# Patient Record
Sex: Male | Born: 1950
Health system: Southern US, Community
[De-identification: ages and names within clinical notes are randomized; demographics above are authoritative.]

## PROBLEM LIST (undated history)

## (undated) DIAGNOSIS — Z9889 Other specified postprocedural states: Secondary | ICD-10-CM

## (undated) DIAGNOSIS — I10 Essential (primary) hypertension: Secondary | ICD-10-CM

## (undated) DIAGNOSIS — B2790 Infectious mononucleosis, unspecified without complication: Secondary | ICD-10-CM

## (undated) DIAGNOSIS — M722 Plantar fascial fibromatosis: Secondary | ICD-10-CM

## (undated) DIAGNOSIS — B029 Zoster without complications: Secondary | ICD-10-CM

## (undated) DIAGNOSIS — R112 Nausea with vomiting, unspecified: Secondary | ICD-10-CM

## (undated) DIAGNOSIS — E291 Testicular hypofunction: Secondary | ICD-10-CM

## (undated) DIAGNOSIS — N400 Enlarged prostate without lower urinary tract symptoms: Secondary | ICD-10-CM

## (undated) HISTORY — PX: MOUTH SURGERY: SHX715

## (undated) HISTORY — PX: HERNIA REPAIR: SHX51

## (undated) HISTORY — DX: Plantar fascial fibromatosis: M72.2

## (undated) HISTORY — DX: Benign prostatic hyperplasia without lower urinary tract symptoms: N40.0

## (undated) HISTORY — DX: Infectious mononucleosis, unspecified without complication: B27.90

## (undated) HISTORY — PX: INGUINAL HERNIA REPAIR: SHX194

## (undated) HISTORY — DX: Zoster without complications: B02.9

## (undated) HISTORY — PX: POLYPECTOMY: SHX149

## (undated) HISTORY — DX: Essential (primary) hypertension: I10

## (undated) HISTORY — DX: Testicular hypofunction: E29.1

---
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
## 1988-02-17 HISTORY — PX: WISDOM TOOTH EXTRACTION: SHX21

## 1999-07-18 ENCOUNTER — Encounter (INDEPENDENT_AMBULATORY_CARE_PROVIDER_SITE_OTHER): Payer: Self-pay

## 1999-07-18 ENCOUNTER — Other Ambulatory Visit: Admission: RE | Admit: 1999-07-18 | Discharge: 1999-07-18 | Payer: Self-pay | Admitting: Gastroenterology

## 2004-11-24 ENCOUNTER — Ambulatory Visit: Payer: Self-pay | Admitting: Internal Medicine

## 2005-01-21 ENCOUNTER — Ambulatory Visit: Payer: Self-pay | Admitting: Internal Medicine

## 2005-02-16 HISTORY — PX: OTHER SURGICAL HISTORY: SHX169

## 2005-09-25 ENCOUNTER — Ambulatory Visit (HOSPITAL_BASED_OUTPATIENT_CLINIC_OR_DEPARTMENT_OTHER): Admission: RE | Admit: 2005-09-25 | Discharge: 2005-09-25 | Payer: Self-pay | Admitting: Urology

## 2006-03-26 ENCOUNTER — Ambulatory Visit: Payer: Self-pay | Admitting: Internal Medicine

## 2006-03-26 LAB — CONVERTED CEMR LAB
Calcium: 9.3 mg/dL (ref 8.4–10.5)
Chloride: 104 meq/L (ref 96–112)
Creatinine, Ser: 0.9 mg/dL (ref 0.4–1.5)
Glucose, Bld: 101 mg/dL — ABNORMAL HIGH (ref 70–99)
Hgb A1c MFr Bld: 5.2 % (ref 4.6–6.0)
Sodium: 142 meq/L (ref 135–145)

## 2006-04-01 ENCOUNTER — Ambulatory Visit: Payer: Self-pay | Admitting: Internal Medicine

## 2006-04-01 LAB — CONVERTED CEMR LAB: Testosterone: 269.26 ng/dL — ABNORMAL LOW (ref 350.00–890)

## 2006-04-05 ENCOUNTER — Ambulatory Visit: Payer: Self-pay | Admitting: Internal Medicine

## 2007-01-18 ENCOUNTER — Encounter: Payer: Self-pay | Admitting: Internal Medicine

## 2007-01-22 ENCOUNTER — Encounter: Payer: Self-pay | Admitting: *Deleted

## 2007-01-22 DIAGNOSIS — E291 Testicular hypofunction: Secondary | ICD-10-CM

## 2007-01-22 DIAGNOSIS — N476 Balanoposthitis: Secondary | ICD-10-CM

## 2007-01-22 DIAGNOSIS — Z87442 Personal history of urinary calculi: Secondary | ICD-10-CM

## 2007-01-22 DIAGNOSIS — B029 Zoster without complications: Secondary | ICD-10-CM | POA: Insufficient documentation

## 2007-01-22 DIAGNOSIS — Z9889 Other specified postprocedural states: Secondary | ICD-10-CM | POA: Insufficient documentation

## 2007-01-22 DIAGNOSIS — M722 Plantar fascial fibromatosis: Secondary | ICD-10-CM | POA: Insufficient documentation

## 2007-02-07 ENCOUNTER — Telehealth: Payer: Self-pay | Admitting: Internal Medicine

## 2007-03-17 ENCOUNTER — Ambulatory Visit: Payer: Self-pay | Admitting: Internal Medicine

## 2007-03-17 DIAGNOSIS — J069 Acute upper respiratory infection, unspecified: Secondary | ICD-10-CM | POA: Insufficient documentation

## 2007-05-21 ENCOUNTER — Encounter: Payer: Self-pay | Admitting: Internal Medicine

## 2007-06-15 ENCOUNTER — Encounter: Payer: Self-pay | Admitting: Internal Medicine

## 2007-10-20 ENCOUNTER — Telehealth: Payer: Self-pay | Admitting: Internal Medicine

## 2007-12-16 ENCOUNTER — Ambulatory Visit: Payer: Self-pay | Admitting: Internal Medicine

## 2008-04-18 ENCOUNTER — Telehealth: Payer: Self-pay | Admitting: Internal Medicine

## 2008-04-27 ENCOUNTER — Telehealth: Payer: Self-pay | Admitting: Internal Medicine

## 2008-06-12 ENCOUNTER — Encounter: Payer: Self-pay | Admitting: Internal Medicine

## 2008-06-29 ENCOUNTER — Ambulatory Visit: Payer: Self-pay | Admitting: Endocrinology

## 2008-11-13 ENCOUNTER — Ambulatory Visit: Payer: Self-pay | Admitting: Internal Medicine

## 2009-03-20 ENCOUNTER — Telehealth: Payer: Self-pay | Admitting: Internal Medicine

## 2009-03-21 ENCOUNTER — Telehealth (INDEPENDENT_AMBULATORY_CARE_PROVIDER_SITE_OTHER): Payer: Self-pay | Admitting: *Deleted

## 2009-04-05 ENCOUNTER — Telehealth: Payer: Self-pay | Admitting: Internal Medicine

## 2009-07-02 ENCOUNTER — Encounter (INDEPENDENT_AMBULATORY_CARE_PROVIDER_SITE_OTHER): Payer: Self-pay | Admitting: *Deleted

## 2009-07-16 ENCOUNTER — Encounter: Payer: Self-pay | Admitting: Internal Medicine

## 2009-07-16 ENCOUNTER — Telehealth: Payer: Self-pay | Admitting: Internal Medicine

## 2009-07-25 ENCOUNTER — Encounter: Payer: Self-pay | Admitting: Internal Medicine

## 2009-08-02 ENCOUNTER — Encounter: Payer: Self-pay | Admitting: Internal Medicine

## 2009-11-12 ENCOUNTER — Telehealth: Payer: Self-pay | Admitting: Internal Medicine

## 2009-11-15 ENCOUNTER — Telehealth: Payer: Self-pay | Admitting: Internal Medicine

## 2009-12-23 ENCOUNTER — Telehealth: Payer: Self-pay | Admitting: Internal Medicine

## 2010-03-18 NOTE — Progress Notes (Signed)
  Phone Note Refill Request Message from:  Fax from Pharmacy on November 15, 2009 10:49 AM  Refills Requested: Medication #1:  ANDROGEL 1 %  GEL (TESTOSTERONE) apply one packet  to skin every morning   Last Refilled: 09/25/2009 Fax from [facility] on [address] in [place], please Advise refills  Initial call taken by: Ami Bullins CMA,  November 15, 2009 10:50 AM  Follow-up for Phone Call        ok to refill as needed  Follow-up by: Jacques Navy MD,  November 15, 2009 1:03 PM    Prescriptions: ANDROGEL 1 %  GEL (TESTOSTERONE) apply one packet  to skin every morning  #90 x 1   Entered by:   Ami Bullins CMA   Authorized by:   Jacques Navy MD   Signed by:   Bill Salinas CMA on 11/15/2009   Method used:   Faxed to ...       [facility] [id number] (retail)       [address]       [place], [place]  [address]       Ph: [phone] or [phone]       Fax: [phone]   RxID:   [id number]

## 2010-03-18 NOTE — Progress Notes (Signed)
Summary: PA--Androgel  Phone Note From Pharmacy   Summary of Call: PA request--Androgel. Form completed. Initial call taken by: Lucious Groves,  Jul 16, 2009 9:12 AM  Follow-up for Phone Call        [facility] will not process prior authorization without the following: 1. What other meds have been tried 2. Does the patient have/had dx of hypogonadism. See form for review. Thanks Follow-up by: Lucious Groves,  July 24, 2009 10:20 AM  Additional Follow-up for Phone Call Additional follow up Details #1::        Patient has dx of hypgonadism - 04/01/06 testosterone level 269 ( nl [id number]).  No other products tried - this works and he has stayed with it. Additional Follow-up by: Jacques Navy MD,  July 24, 2009 5:00 PM    Additional Follow-up for Phone Call Additional follow up Details #2::    corrected and re-faxed for approval on 07-18-09. Still awaiting insurance company reply. Follow-up by: Lucious Groves,  July 29, 2009 4:39 PM   Appended Document: PA--Androgel approved until 2012.

## 2010-03-18 NOTE — Medication Information (Signed)
Summary: Prior Auth/[facility]  Prior Auth/[facility]   Imported By: Lester [place] 07/26/2009 10:28:38  _____________________________________________________________________  External Attachment:    Type:   Image     Comment:   External Document

## 2010-03-18 NOTE — Progress Notes (Signed)
  Phone Note Refill Request Message from:  Fax from Pharmacy on March 20, 2009 9:16 AM  Refills Requested: Medication #1:  ANDROGEL PUMP 1 %  GEL apply one packet  to skin every morning   Last Refilled: 09/12/2008 Please Advise refill, recieved fax from [facility] [address]   Initial call taken by: Bill Salinas CMA,  March 20, 2009 9:16 AM  Follow-up for Phone Call        OKI for refill prn Follow-up by: Jacques Navy MD,  March 20, 2009 3:04 PM    Prescriptions: ANDROGEL PUMP 1 %  GEL (TESTOSTERONE) apply one packet  to skin every morning  #30 x 2   Entered by:   Bill Salinas CMA   Authorized by:   Jacques Navy MD   Signed by:   Bill Salinas CMA on 03/21/2009   Method used:   Telephoned to ...       [facility] [address] (retail)       [phone]       [place], [place]  [address]       Ph: [phone]       Fax: [phone]   RxID:   [id number]

## 2010-03-18 NOTE — Letter (Signed)
Summary: Colonoscopy Letter  [facility]  [address] [place], [place] [address]   Phone: [phone]  Fax: [phone]      Jul 02, 2009 MRN: [id number]   Richard [name] [address] [place], [place]  [address]   Dear [name],   According to your medical record, it is time for you to schedule a Colonoscopy. The American Cancer Society recommends this procedure as a method to detect early colon cancer. Patients with a family history of colon cancer, or a personal history of colon polyps or inflammatory bowel disease are at increased risk.  This letter has beeen generated based on the recommendations made at the time of your procedure. If you feel that in your particular situation this may no longer apply, please contact our office.  Please call our office at [phone] to schedule this appointment or to update your records at your earliest convenience.  Thank you for cooperating with [place] to provide you with the very best care possible.   Sincerely,  Judie Petit T. Russella Dar, M.D.  [facility]astro[facility]ology Division [phone]

## 2010-03-18 NOTE — Progress Notes (Signed)
Summary: Hemocult  Phone Note Call from Patient Call back at [facility] Phone [phone]   Summary of Call: Patient is requesting hemocult cards. Ok to provide?  Initial call taken by: Lamar Sprinkles, CMA,  December 23, 2009 9:43 AM  Follow-up for Phone Call        per chart patient had recall letter in May for colonoscopy - this is much more important than hemocult cards. He should call GI to schedule colonoscopy. Follow-up by: Jacques Navy MD,  December 23, 2009 1:58 PM  Additional Follow-up for Phone Call Additional follow up Details #1::        Left detailed vm for patient.  Additional Follow-up by: Lamar Sprinkles, CMA,  December 23, 2009 2:12 PM

## 2010-03-18 NOTE — Medication Information (Signed)
Summary: Androgel Approved/[facility]  Androgel Approved/[facility]   Imported By: Sherian Rein 08/05/2009 14:54:28  _____________________________________________________________________  External Attachment:    Type:   Image     Comment:   External Document

## 2010-03-18 NOTE — Progress Notes (Signed)
Summary: REFILL ANDROGEL  Phone Note Call from Patient Call back at [facility] Phone [phone]   Initial call taken by: Lamar Sprinkles, CMA,  April 05, 2009 10:46 AM Summary of Call: Patient is requesting refill of testosterone. Rx was called in 03/20/2009. Need to clarify which pharm pt wants rx to go to.  Initial call taken by: Lamar Sprinkles, CMA,  April 05, 2009 10:47 AM  Follow-up for Phone Call        [facility] [address] in [place] Follow-up by: Ami Bullins CMA,  April 05, 2009 11:27 AM  Additional Follow-up for Phone Call Additional follow up Details #1::        Pt informed  Additional Follow-up by: Lamar Sprinkles, CMA,  April 05, 2009 5:41 PM    Prescriptions: ANDROGEL 1 %  GEL (TESTOSTERONE) apply one packet  to skin every morning  #90 x 1   Entered by:   Lamar Sprinkles, CMA   Authorized by:   Jacques Navy MD   Signed by:   Lamar Sprinkles, CMA on 04/05/2009   Method used:   Telephoned to ...       [facility] [address] (retail)       [address] [place], [place]  [address]       Ph: [phone] or [phone]       Fax: [phone]   RxID:   [id number]

## 2010-03-18 NOTE — Progress Notes (Signed)
Summary: REFILL   Phone Note Refill Request Message from:  Pharmacy  Refills Requested: Medication #1:  CARDURA 4 MG  TABS Take 1 tablet by mouth once a day   Notes: [facility]- supply w/3rfs Initial call taken by: Lamar Sprinkles, CMA,  November 12, 2009 10:54 AM  Follow-up for Phone Call        ok to refill as needed  Follow-up by: Jacques Navy MD,  November 12, 2009 1:10 PM    Prescriptions: CARDURA 4 MG  TABS (DOXAZOSIN MESYLATE) Take 1 tablet by mouth once a day  #90 Each x 2   Entered by:   Bill Salinas CMA   Authorized by:   Jacques Navy MD   Signed by:   Bill Salinas CMA on 11/12/2009   Method used:   Faxed to ...       [facility] education [facility])       [address] [address]       [place], [place]  [address]       Ph: [phone]       Fax: [phone]   RxID:   [id number]

## 2010-03-18 NOTE — Medication Information (Signed)
Summary: Prior Autho for Androgel/[facility]  Prior Autho for [employer]   Imported By: Sherian Rein 07/22/2009 14:53:40  _____________________________________________________________________  External Attachment:    Type:   Image     Comment:   External Document

## 2010-03-18 NOTE — Medication Information (Signed)
Summary: Andrgel Delayed/[facility]  Andrgel Delayed/[facility]   Imported By: Sherian Rein 07/29/2009 08:08:08  _____________________________________________________________________  External Attachment:    Type:   Image     Comment:   External Document

## 2010-03-18 NOTE — Progress Notes (Signed)
  Phone Note Call from Patient   Caller: Elita Quick [phone] [facility] Call For:  Dr Debby Bud Summary of Call: Please call pharmacy they have question about prescription was called in today, pump or packet?  Initial call taken by: Verdell Face,  March 21, 2009 9:45 AM  Follow-up for Phone Call        informed pharm [name]) that pt get 50mg . Follow-up by: Ami Bullins CMA,  March 21, 2009 3:50 PM    New/Updated Medications: * ANDROGEL 1 %  GEL (TESTOSTERONE) apply one packet  to skin every morning

## 2010-07-04 NOTE — Assessment & Plan Note (Signed)
[facility]                           PRIMARY CARE OFFICE NOTE   Richard [name], Richard [name]                    MRN:          [id number]  DATE:04/01/2006                            DOB:          [birth year]    Richard [name] is a 60 year old gentleman who presents for ongoing care  and evaluation.  He was last seen in the office on January 21, 2005, for  rash.   The patient's last physical exam was November 24, 2004, and please see  that dictated note with a full past medical history, family history and  social history.   CHIEF COMPLAINT:  1. Hip pain.  The patient reports he has had episodes of sudden onset      of fleeting, lancinating pain in his hip from buttock to leg on the      left.  This can happen at rest as well as with activity.  2. Plantar fasciitis, a chronic ongoing problem.  3. Tenderness about the great left toe.  4. History of upper respiratory infection about 1 month ago with      persistent cough, but this has recently resolved.   CURRENT MEDICATIONS:  1. Cardura 2 mg daily.  2. Multivitamin, vitamin D, vitamin B, zinc and calcium.  3. Etodolac on a p.r.n. basis.   REVIEW OF SYSTEMS:  Negative for any fevers, sweats, chills or other  constitutional symptoms.  He has had an eye exam in the last 12 months.  No ENT or cardiovascular problems.  GU:  Significant for decreased  libido and decreased rigidity.   INTERVAL SOCIAL HISTORY:  The patient has changed work description,  doing much less management, and has cut his work week down to 50-55  hours.  With his extra time he is doing more exercise, including push-  ups, biking, sit-ups, and general health maintenance.   PHYSICAL EXAMINATION:  VITAL SIGNS:  Temperature was 98.3, blood  pressure was 109/70, pulse 69, weight 192, although the patient reports  he weighs 180 at home.  GENERAL APPEARANCE:  This is a well-nourished, tall gentleman in no  acute distress.  HEENT:   Normocephalic, atraumatic.  EACs and TMs unremarkable.  Oropharynx with native dentition in good repair, no buccal or palatal  lesions were noted.  Posterior pharynx was clear.  Conjunctivae and  sclerae were clear.  PERRLA, EOMI.  Funduscopic exam was unremarkable.  NECK:  Supple without thyromegaly.  NODES:  No adenopathy was noted in the cervical or supraclavicular  regions.  CHEST:  No CVA tenderness.  LUNGS:  Clear to auscultation and percussion.  CARDIOVASCULAR:  2+ radial pulse, no JVD or carotid bruits.  He had a  quiet precordium with regular rate and rhythm without murmurs, rubs or  gallops.  ABDOMEN:  Soft, no guarding, no rebound.  No organosplenomegaly was  appreciated.  GENITALIA:  Normal male, bilaterally descended testicles without masses.  RECTAL:  Normal sphincter tone was noted.  Prostate was smooth, round,  and normal size and contour without nodules.  EXTREMITIES:  Without clubbing, cyanosis, edema or deformity.  NEUROLOGIC:  Nonfocal.   DATA  BASE:  Electrolytes were unremarkable.  Serum glucose was normal at  101.  A1c was normal at 5.2%.  PSA was normal at 0.45.   ASSESSMENT AND PLAN:  1. Benign prostatic hypertrophy.  The patient is doing very well with      Cardura.  2. Health maintenance.  The patient would be due for a colonoscopy,      and referral will be made.  The patient's laboratory work is      unremarkable.  3. Patient with some question or decreased libido.  He will be sent      for a testosterone level as a first step in his evaluation.  He      will be notified by phone tree of these results.   In summary, he seems to be a very healthy gentleman doing well at this  time.  He will be scheduled for colonoscopy.  He will be notified of his  labs by phone tree.   addendum: testosterone level was low.     Rosalyn Gess Norins, MD  Electronically Signed    MEN/MedQ  DD: 04/02/2006  DT: 04/02/2006  Job #: [id number]   cc:   Richard [name]

## 2010-07-04 NOTE — Op Note (Signed)
Richard [name], Richard [name]             ACCOUNT NO.:  [id number]   MEDICAL RECORD NO.:  [id number]          PATIENT TYPE:  AMB   LOCATION:  [facility]                         [facility]ACILITY[facility]  [facility]   PHYSICIAN:  Boston Service, M.D.DATE OF BIRTH:  1951-01-13   DATE OF PROCEDURE:  09/25/2005  DATE OF DISCHARGE:                                 OPERATIVE REPORT   PREOPERATIVE DIAGNOSIS:  Phimosis.   POSTOPERATIVE DIAGNOSIS:  Phimosis and balanitis.   PROCEDURE:  Circumcision.   SURGEON:  Boston Service, M.D.   ASSISTANTS:  None.   ANESTHESIA:  General.   SPECIMENS:  None.   ESTIMATED BLOOD LOSS:  Minimal.   COMPLICATIONS:  None obvious.   DESCRIPTION OF PROCEDURE:  The patient was prepped and draped in the supine  position after institution of adequate level general anesthesia.  Penile  block with 0.25% lidocaine without epinephrine was instituted at the base of  the penis.  A circumferential incision was made proximal to the sub-coronal  sulcus.  A similar incision was made proximal to the original incision and a  ring of redundant preputial skin was removed in a parallel lines  technique.  Subcu was reapproximated with interrupted sutures of 4-0  Vicryl.  Skin was reapproximated with interrupted sutures of 4-0 chromic.  The wound was covered with bacitracin ointment, dry gauze and Coban tape.  The patient was returned to Recovery in satisfactory condition.           ______________________________  Boston Service, M.D.     RH/MEDQ  D:  09/25/2005  T:  09/25/2005  Job:  [id number]   cc:   Oley Balm. Georgina Pillion, M.D.  Fax: [phone]

## 2010-10-10 ENCOUNTER — Telehealth: Payer: Self-pay

## 2010-10-17 NOTE — Telephone Encounter (Signed)
Left another message regarding scheduling recall Colonoscopy. Letter to be mailed. 

## 2011-02-17 HISTORY — PX: COLONOSCOPY: SHX174

## 2011-04-16 ENCOUNTER — Ambulatory Visit (INDEPENDENT_AMBULATORY_CARE_PROVIDER_SITE_OTHER): Payer: BC Managed Care – PPO | Admitting: Internal Medicine

## 2011-04-16 ENCOUNTER — Encounter: Payer: Self-pay | Admitting: Internal Medicine

## 2011-04-16 ENCOUNTER — Other Ambulatory Visit (INDEPENDENT_AMBULATORY_CARE_PROVIDER_SITE_OTHER): Payer: BC Managed Care – PPO

## 2011-04-16 VITALS — BP 102/66 | HR 83 | Temp 96.8°F | Resp 16 | Ht 72.25 in | Wt 201.8 lb

## 2011-04-16 DIAGNOSIS — Z0000 Encounter for general adult medical examination without abnormal findings: Secondary | ICD-10-CM

## 2011-04-16 DIAGNOSIS — Z136 Encounter for screening for cardiovascular disorders: Secondary | ICD-10-CM

## 2011-04-16 DIAGNOSIS — Z23 Encounter for immunization: Secondary | ICD-10-CM

## 2011-04-16 DIAGNOSIS — Z87898 Personal history of other specified conditions: Secondary | ICD-10-CM

## 2011-04-16 DIAGNOSIS — E291 Testicular hypofunction: Secondary | ICD-10-CM

## 2011-04-16 LAB — LIPID PANEL
Cholesterol: 153 mg/dL (ref 0–200)
Total CHOL/HDL Ratio: 4
Triglycerides: 262 mg/dL — ABNORMAL HIGH (ref 0.0–149.0)
VLDL: 52.4 mg/dL — ABNORMAL HIGH (ref 0.0–40.0)

## 2011-04-16 LAB — LDL CHOLESTEROL, DIRECT: Direct LDL: 80.9 mg/dL

## 2011-04-16 LAB — COMPREHENSIVE METABOLIC PANEL
Albumin: 4.2 g/dL (ref 3.5–5.2)
BUN: 19 mg/dL (ref 6–23)
Calcium: 9 mg/dL (ref 8.4–10.5)
Chloride: 105 mEq/L (ref 96–112)
Creatinine, Ser: 1 mg/dL (ref 0.4–1.5)
GFR: 83.43 mL/min (ref >60.00)
Glucose, Bld: 96 mg/dL (ref 70–99)
Potassium: 4.2 mEq/L (ref 3.5–5.1)

## 2011-04-16 LAB — HEPATIC FUNCTION PANEL
Alkaline Phosphatase: 67 U/L (ref 39–117)
Bilirubin, Direct: 0.1 mg/dL (ref 0.0–0.3)
Total Bilirubin: 0.6 mg/dL (ref 0.3–1.2)

## 2011-04-16 NOTE — Progress Notes (Signed)
Subjective:    Patient ID: Richard [name], male    DOB: 1950/11/14, 61 y.o.   MRN: [id number]  HPI Richard [name] presents for a routine general medical exam. He has had a cold/sore throat for 3 weeks - felt like strep. For several days he has had more sinus symptoms. He has not had any major illnesses, surgeries, or injuries. He has some minor onychyomycosis.  Past Medical History  Diagnosis Date  . Mumps   . Zoster   . Mononucleosis   . BPH (benign prostatic hyperplasia)   . Hypogonadism male    Past Surgical History  Procedure Date  . Inguinal hernia repair childhood    left  . Circumsicion 2007    asa adult  . Mouth surgery     early eruption of incisors requiring surgical extraction   Family History  Problem Relation Age of Onset  . Arthritis Mother     spinal stenosis s/p surgery  . Diabetes Neg Hx   . Heart disease Neg Hx    History   Social History  . Marital Status: Married    Spouse Name: N/A    Number of Children: 3  . Years of Education: 16   Occupational History  . sales    Social History Main Topics  . Smoking status: Former Smoker    Quit date: 04/16/1967  . Smokeless tobacco: Never Used  . Alcohol Use: Yes     once or twice a month  . Drug Use: No  . Sexually Active: Yes -- Male partner(s)   Other Topics Concern  . Not on file   Social History Narrative   HSG, [place] - [place]. Married '75. 2 sons - '71(adopted), '76; 1 dtr (adopted) '73; 4 grandchildren.Work - Secretary/administrator, currently Therapist, occupational. Marriage is in good health. ACP - HCPOA - wife and son; yes - CPR; yes for acute care; no prolonged futile or heroic measures.JEHOVAH'S WITNESS - OK for cell salvage, volume expanded, immunoglobulins. Solid organ transplant - ????          Review of Systems Constitutional:  Negative for fever, chills, activity change and unexpected weight change.  HEENT:  Negative for hearing loss, ear pain, congestion, neck stiffness and postnasal  drip. Negative for sore throat or swallowing problems. Negative for dental complaints.   Eyes: Negative for vision loss or change in visual acuity.  Respiratory: Negative for chest tightness and wheezing. Negative for DOE.   Cardiovascular: Negative for chest pain or palpitations. No decreased exercise tolerance Gastrointestinal: No change in bowel habit. No bloating or gas. No reflux or indigestion Genitourinary: Negative for urgency, frequency, flank pain and difficulty urinating.  Musculoskeletal: Negative for myalgias, back pain, arthralgias and gait problem.  Neurological: Negative for dizziness, tremors, weakness and headaches.  Hematological: Negative for adenopathy.  Psychiatric/Behavioral: Negative for behavioral problems and dysphoric mood.       Objective:   Physical Exam Filed Vitals:   04/16/11 0914  BP: 102/66  Pulse: 83  Temp: 96.8 F (36 C)  Resp: 16   Gen'l: Well nourished well developed white male in no acute distress  HEENT: Head: Normocephalic and atraumatic. Right Ear: External ear normal. EAC with cerumen impaction/TM not seen. Left Ear: External ear normal.  EAC-cerumen impaction/TM not seen. Nose: Nose normal. Mouth/Throat: Oropharynx is clear and moist. Dentition - native, in good repair. No buccal or palatal lesions. Posterior pharynx clear. Eyes: Conjunctivae and sclera clear. EOM intact. Pupils are equal, round, and reactive  to light. Right eye exhibits no discharge. Left eye exhibits no discharge. Neck: Normal range of motion. Neck supple. No JVD present. No tracheal deviation present. No thyromegaly present.  Cardiovascular: Normal rate, regular rhythm, no gallop, no friction rub, no murmur heard.      Quiet precordium. 2+ radial and DP pulses . No carotid bruits Pulmonary/Chest: Effort normal. No respiratory distress or increased WOB, no wheezes, no rales. No chest wall deformity or CVAT. Abdominal: Soft. Bowel sounds are normal in all quadrants. He  exhibits no distension, no tenderness, no rebound or guarding, No heptosplenomegaly  Genitourinary:  deferred - reviewed USPHTF recommendations. Musculoskeletal: Normal range of motion. He exhibits no edema and no tenderness.       Small and large joints without redness, synovial thickening or deformity. Full range of motion preserved about all small, median and large joints.  Lymphadenopathy:    He has no cervical or supraclavicular adenopathy.  Neurological: He is alert and oriented to person, place, and time. CN II-XII intact. DTRs 2+ and symmetrical biceps, radial and patellar tendons. Cerebellar function normal with no tremor, rigidity, normal gait and station.  Skin: Skin is warm and dry. No rash noted. No erythema. Large - 7 cm soft tissue mass upper left back - non tender. Psychiatric: He has a normal mood and affect. His behavior is normal. Thought content normal.   Lab Results  Component Value Date   GLUCOSE 96 04/16/2011   CHOL 153 04/16/2011   TRIG 262.0* 04/16/2011   HDL 35.30* 04/16/2011   LDLDIRECT 80.9 04/16/2011        ALT 20 04/16/2011   AST 21 04/16/2011        NA 139 04/16/2011   K 4.2 04/16/2011   CL 105 04/16/2011   CREATININE 1.0 04/16/2011   BUN 19 04/16/2011   CO2 28 04/16/2011   TSH 1.47 04/16/2011   PSA 0.45 03/26/2006   HGBA1C 5.2 03/26/2006         Assessment & Plan:

## 2011-04-17 ENCOUNTER — Encounter: Payer: Self-pay | Admitting: Internal Medicine

## 2011-04-17 DIAGNOSIS — Z0000 Encounter for general adult medical examination without abnormal findings: Secondary | ICD-10-CM | POA: Insufficient documentation

## 2011-04-17 NOTE — Assessment & Plan Note (Signed)
Symptoms are adequately controlled with doxazosin. Plan - continue present regimen

## 2011-04-17 NOTE — Assessment & Plan Note (Signed)
He had been on testosterone replacement but discontinued on his own - did not really see any benefit. He has no functional problems

## 2011-04-17 NOTE — Assessment & Plan Note (Addendum)
Interval medical history is benign. Physical exam is normal except for mild excessive weight. Lab results are in normal range except for mildly elevated triglycerides which does not require any medical therapy - low carb diet and exercise should suffice. Will pull old record in regard to last colonoscopy. Immunizations - current for Tdap and Pneumonia vaccine.   In summary - a healthy, medically stable man. He will return for routine exam in 2-3 years otherwise on a prn basis.

## 2011-08-11 ENCOUNTER — Other Ambulatory Visit: Payer: Self-pay

## 2011-08-11 DIAGNOSIS — Z0000 Encounter for general adult medical examination without abnormal findings: Secondary | ICD-10-CM

## 2011-08-11 MED ORDER — DOXAZOSIN MESYLATE 4 MG PO TABS: 2.0000 mg | ORAL_TABLET | Freq: Every day | ORAL | Status: DC

## 2011-09-11 ENCOUNTER — Encounter: Payer: Self-pay | Admitting: Gastroenterology

## 2011-10-03 ENCOUNTER — Ambulatory Visit (INDEPENDENT_AMBULATORY_CARE_PROVIDER_SITE_OTHER): Payer: BC Managed Care – PPO | Admitting: Family Medicine

## 2011-10-03 ENCOUNTER — Encounter: Payer: Self-pay | Admitting: Family Medicine

## 2011-10-03 VITALS — BP 110/76 | HR 73 | Temp 98.1°F | Wt 204.0 lb

## 2011-10-03 DIAGNOSIS — J329 Chronic sinusitis, unspecified: Secondary | ICD-10-CM

## 2011-10-03 MED ORDER — AMOXICILLIN-POT CLAVULANATE 875-125 MG PO TABS: 1.0000 | ORAL_TABLET | Freq: Two times a day (BID) | ORAL | Status: AC

## 2011-10-03 NOTE — Progress Notes (Signed)
  Subjective:    Patient ID: Richard [name], male    DOB: 1950/09/11, 61 y.o.   MRN: [id number]  HPI Head and chest congestion x 2 weeks. Tried some OTC cough and cold symptoms. - some relief.  Cough is mostly dry.   Mucous is white/yellow. No fever, but not sure.   Some HA and pressure in the ears and ST for 2 weeks.  Feels like pin-pricks in the lungs.  +post nasal drip. No runny nose.     Review of Systems     Objective:   Physical Exam  Constitutional: He is oriented to person, place, and time. He appears well-developed and well-nourished.  HENT:  Head: Normocephalic and atraumatic.  Right Ear: External ear normal.  Left Ear: External ear normal.  Nose: Nose normal.  Mouth/Throat: Oropharynx is clear and moist.       TMs and canals are clear.   Eyes: Conjunctivae and EOM are normal. Pupils are equal, round, and reactive to light.  Neck: Neck supple. No thyromegaly present.  Cardiovascular: Normal rate and normal heart sounds.   Pulmonary/Chest: Effort normal and breath sounds normal.  Lymphadenopathy:    He has no cervical adenopathy.  Neurological: He is alert and oriented to person, place, and time.  Skin: Skin is warm and dry.  Psychiatric: He has a normal mood and affect.          Assessment & Plan:  Sinusitis - Will tx with augmentin since sxs x 2 weeks.  Call if not getting better or suddenly worse. Can continue OTC meds since does seem to help some.  Make sure staying hydrated.

## 2011-10-03 NOTE — Patient Instructions (Signed)

## 2011-12-17 ENCOUNTER — Encounter: Payer: Self-pay | Admitting: Gastroenterology

## 2012-01-18 ENCOUNTER — Ambulatory Visit (INDEPENDENT_AMBULATORY_CARE_PROVIDER_SITE_OTHER): Payer: BC Managed Care – PPO | Admitting: Internal Medicine

## 2012-01-18 ENCOUNTER — Encounter: Payer: Self-pay | Admitting: Internal Medicine

## 2012-01-18 VITALS — BP 110/60 | HR 81 | Temp 97.6°F | Resp 8 | Wt 206.1 lb

## 2012-01-18 DIAGNOSIS — H1130 Conjunctival hemorrhage, unspecified eye: Secondary | ICD-10-CM

## 2012-01-18 NOTE — Progress Notes (Signed)
  Subjective:    Patient ID: Richard [name], male    DOB: [birth year], 61 y.o.   MRN: [id number]  HPI Richard [name] presents due to sudden on set of a very red left eye. He had no trauma, no surgery, he denies pain, change in vision, drainage or matting.  PMH, FamHx and SocHx reviewed for any changes and relevance.  Current Outpatient Prescriptions on File Prior to Visit  Medication Sig Dispense Refill  . doxazosin (CARDURA) 4 MG tablet Take 0.5 tablets (2 mg total) by mouth at bedtime.  45 tablet  3  . OVER THE COUNTER MEDICATION Take by mouth as needed. OTC Cold medication.          Review of Systems System review is negative for any constitutional, cardiac, pulmonary, GI or neuro symptoms or complaints other than as described in the HPI.     Objective:   Physical Exam Filed Vitals:   01/18/12 1127  BP: 110/60  Pulse: 81  Temp: 97.6 F (36.4 C)  Resp: 8   Gen'l - WNWD white man in no distress HEENT- bulbar conjunctiva with circumferential hemorrhage, iris not involved, pupil round and reactive, vision grossly normal.       Assessment & Plan:  Subconjunctival hemorrhage - vision in tact  Plan- no intervention. Patient education provided (see AVS)

## 2012-01-18 NOTE — Patient Instructions (Addendum)
Subconjunctival hemorrhage left eye - usually a spontaneous benign problem, expecially if there is no involvement of the iris or pupil. Time is the only treatment.    Subconjunctival Hemorrhage A subconjunctival hemorrhage is a bright red patch covering a portion of the white of the eye. The white part of the eye is called the sclera, and it is covered by a thin membrane called the conjunctiva. This membrane is clear, except for tiny blood vessels that you can see with the naked eye. When your eye is irritated or inflamed and becomes red, it is because the vessels in the conjunctiva are swollen. Sometimes, a blood vessel in the conjunctiva can break and bleed. When this occurs, the blood builds up between the conjunctiva and the sclera, and spreads out to create a red area. The red spot may be very small at first. It may then spread to cover a larger part of the surface of the eye, or even all of the visible white part of the eye. In almost all cases, the blood will go away and the eye will become white again. Before completely dissolving, however, the red area may spread. It may also become brownish-yellow in color, before going away. If a lot of blood collects under the conjunctiva, it may look like a bulge on the surface of the eye. This looks scary, but it will also eventually flatten out and go away. Subconjunctival hemorrhages do not cause pain, but if swollen, may cause a feeling of irritation. There is no effect on vision.   CAUSES    The most common cause is mild trauma (rubbing the eye, irritation).   Subconjunctival hemorrhages can happen because of coughing or straining (lifting heavy objects), vomiting, or sneezing.   In some cases, your doctor may want to check your blood pressure. High blood pressure can also cause a sunconjunctival hemorrhage.   Severe trauma or blunt injuries.   Diseases that affect blood clotting (hemophilia, leukemia).   Abnormalities of blood vessels behind the  eye (carotid cavernous sinus fistula).   Tumors behind the eye.   Certain drugs (aspirin, coumadin, heparin).   Recent eye surgery.  HOME CARE INSTRUCTIONS    Do not worry about the appearance of your eye. You may continue your usual activities.   Often, follow-up is not necessary.  SEEK MEDICAL CARE IF:    Your eye becomes painful.   The bleeding does not disappear within 3 weeks.   Bleeding occurs elsewhere, for example, under the skin, in the mouth, or in the other eye.   You have recurring subconjunctival hemorrhages.  SEEK IMMEDIATE MEDICAL CARE IF:    Your vision changes or you have difficulty seeing.   You develop severe headache, persistent vomiting, confusion, or abnormal drowsiness (lethargy).   Your eye seems to bulge or protrude from the eye socket.   You notice the sudden appearance of bruises, or have spontaneous bleeding elsewhere on your body.  Document Released: 02/02/2005 Document Revised: 04/27/2011 Document Reviewed: 12/31/2008 [facility] Patient Information 2013 [place], [place].

## 2012-01-28 ENCOUNTER — Encounter: Payer: Self-pay | Admitting: Gastroenterology

## 2012-01-28 ENCOUNTER — Ambulatory Visit (AMBULATORY_SURGERY_CENTER): Payer: BC Managed Care – PPO | Admitting: *Deleted

## 2012-01-28 VITALS — Ht 73.0 in | Wt 207.6 lb

## 2012-01-28 DIAGNOSIS — Z1211 Encounter for screening for malignant neoplasm of colon: Secondary | ICD-10-CM

## 2012-01-28 MED ORDER — MOVIPREP 100 G PO SOLR: ORAL | Status: DC

## 2012-02-16 ENCOUNTER — Encounter: Payer: Self-pay | Admitting: Gastroenterology

## 2012-02-16 ENCOUNTER — Ambulatory Visit (AMBULATORY_SURGERY_CENTER): Payer: BC Managed Care – PPO | Admitting: Gastroenterology

## 2012-02-16 VITALS — BP 140/76 | HR 53 | Temp 97.6°F | Resp 20 | Ht 73.0 in | Wt 207.6 lb

## 2012-02-16 DIAGNOSIS — Z1211 Encounter for screening for malignant neoplasm of colon: Secondary | ICD-10-CM

## 2012-02-16 DIAGNOSIS — D126 Benign neoplasm of colon, unspecified: Secondary | ICD-10-CM

## 2012-02-16 MED ORDER — SODIUM CHLORIDE 0.9 % IV SOLN: 500.0000 mL | INTRAVENOUS | Status: DC

## 2012-02-16 NOTE — Op Note (Signed)
[facility] [address] [place], [address]   COLONOSCOPY PROCEDURE REPORT  PATIENT: [name], Richard  MR#: [id number] BIRTHDATE: [birth year] , 61  yrs. old GENDER: Male ENDOSCOPIST: Meryl Dare, MD, [facility] PROCEDURE DATE:  02/16/2012 PROCEDURE:   Colonoscopy with snare polypectomy ASA CLASS:   Class II INDICATIONS:average risk screening. MEDICATIONS: MAC sedation, administered by CRNA and propofol (Diprivan) 350mg  IV  DESCRIPTION OF PROCEDURE:   After the risks benefits and alternatives of the procedure were thoroughly explained, informed consent was obtained.  A digital rectal exam revealed no abnormalities of the rectum.   The Fuse-Demo-Scope  endoscope was introduced through the anus and advanced to the cecum, which was identified by both the appendix and ileocecal valve. No adverse events experienced.   The quality of the prep was excellent, using MoviPrep  The instrument was then slowly withdrawn as the colon was fully examined.     COLON FINDINGS: Images taken but only available in hard copy form that will be scanned into EPIC Agricultural [employer]).   A sessile polyp measuring 5 mm in size was found in the transverse colon.  A polypectomy was performed with a cold snare.  The resection was complete and the polyp tissue was completely retrieved.   Mild diverticulosis was noted in the transverse colon and sigmoid colon. The colon was otherwise normal.  There was no diverticulosis, inflammation, polyps or cancers unless previously stated. Retroflexed views revealed no abnormalities. The time to cecum=3 minutes 28 seconds.  Withdrawal time=9 minutes 57 seconds.  The scope was withdrawn and the procedure completed.  COMPLICATIONS: There were no complications.  ENDOSCOPIC IMPRESSION: 1.   Images taken but only available in hard copy form that will be scanned into EPIC [employer]). 2.   Sessile polyp measuring 5 mm in the transverse colon; polypectomy  performed with a cold snare 3.   Mild diverticulosis was noted in the transverse colon and sigmoid colon  RECOMMENDATIONS: 1.  High fiber diet with liberal fluid intake. 2.  Repeat Colonoscopy in 5 years.   eSigned:  Meryl Dare, MD, [facility] 02/16/2012 12:56 PM

## 2012-02-16 NOTE — Progress Notes (Signed)
Patient did not experience any of the following events: a burn prior to discharge; a fall within the facility; wrong site/side/patient/procedure/implant event; or a hospital transfer or hospital admission upon discharge from the facility. (G8907)Patient did not have preoperative order for IV antibiotic SSI prophylaxis. (G8918) ewm 

## 2012-02-16 NOTE — Patient Instructions (Signed)
YOU HAD AN ENDOSCOPIC PROCEDURE TODAY AT THE  ENDOSCOPY CENTER: Refer to the procedure report that was given to you for any specific questions about what was found during the examination.  If the procedure report does not answer your questions, please call your gastroenterologist to clarify.  If you requested that your care partner not be given the details of your procedure findings, then the procedure report has been included in a sealed envelope for you to review at your convenience later.  YOU SHOULD EXPECT: Some feelings of bloating in the abdomen. Passage of more gas than usual.  Walking can help get rid of the air that was put into your GI tract during the procedure and reduce the bloating. If you had a lower endoscopy (such as a colonoscopy or flexible sigmoidoscopy) you may notice spotting of blood in your stool or on the toilet paper. If you underwent a bowel prep for your procedure, then you may not have a normal bowel movement for a few days.  DIET: Your first meal following the procedure should be a light meal and then it is ok to progress to your normal diet.  A half-sandwich or bowl of soup is an example of a good first meal.  Heavy or fried foods are harder to digest and may make you feel nauseous or bloated.  Likewise meals heavy in dairy and vegetables can cause extra gas to form and this can also increase the bloating.  Drink plenty of fluids but you should avoid alcoholic beverages for 24 hours.  ACTIVITY: Your care partner should take you home directly after the procedure.  You should plan to take it easy, moving slowly for the rest of the day.  You can resume normal activity the day after the procedure however you should NOT DRIVE or use heavy machinery for 24 hours (because of the sedation medicines used during the test).    SYMPTOMS TO REPORT IMMEDIATELY: A gastroenterologist can be reached at any hour.  During normal business hours, 8:30 AM to 5:00 PM Monday through Friday,  call [phone].  After hours and on weekends, please call the GI answering service at [phone] who will take a message and have the physician on call contact you.   Following lower endoscopy (colonoscopy or flexible sigmoidoscopy):  Excessive amounts of blood in the stool  Significant tenderness or worsening of abdominal pains  Swelling of the abdomen that is new, acute  Fever of 100F or higher  FOLLOW UP: If any biopsies were taken you will be contacted by phone or by letter within the next 1-3 weeks.  Call your gastroenterologist if you have not heard about the biopsies in 3 weeks.  Our staff will call the home number listed on your records the next business day following your procedure to check on you and address any questions or concerns that you may have at that time regarding the information given to you following your procedure. This is a courtesy call and so if there is no answer at the home number and we have not heard from you through the emergency physician on call, we will assume that you have returned to your regular daily activities without incident.  SIGNATURES/CONFIDENTIALITY: You and/or your care partner have signed paperwork which will be entered into your electronic medical record.  These signatures attest to the fact that that the information above on your After Visit Summary has been reviewed and is understood.  Full responsibility of the confidentiality of this   discharge information lies with you and/or your care-partner.   Handout on polyps, diverticulosis, high fiber diet Repeat colonoscopy in 5 years 

## 2012-02-18 ENCOUNTER — Telehealth: Payer: Self-pay | Admitting: *Deleted

## 2012-02-18 NOTE — Telephone Encounter (Signed)
  Follow up Call-  Call back number 02/16/2012  Post procedure Call Back phone  # [phone]  Permission to leave phone message Yes     Patient questions:  Do you have a fever, pain , or abdominal swelling? no Pain Score  0 *  Have you tolerated food without any problems? yes  Have you been able to return to your normal activities? yes  Do you have any questions about your discharge instructions: Diet   no Medications  no Follow up visit  no  Do you have questions or concerns about your Care? no  Actions: * If pain score is 4 or above: No action needed, pain <4.

## 2012-10-29 ENCOUNTER — Other Ambulatory Visit: Payer: Self-pay | Admitting: Internal Medicine

## 2012-10-31 ENCOUNTER — Encounter: Payer: Self-pay | Admitting: Internal Medicine

## 2012-10-31 MED ORDER — DOXAZOSIN MESYLATE 4 MG PO TABS: 2.0000 mg | ORAL_TABLET | Freq: Every day | ORAL | Status: DC

## 2012-12-22 ENCOUNTER — Other Ambulatory Visit: Payer: Self-pay

## 2013-06-22 ENCOUNTER — Other Ambulatory Visit: Payer: Self-pay | Admitting: *Deleted

## 2013-06-22 MED ORDER — DOXAZOSIN MESYLATE 4 MG PO TABS: 2.0000 mg | ORAL_TABLET | Freq: Every day | ORAL | Status: DC

## 2013-07-17 ENCOUNTER — Telehealth: Payer: Self-pay | Admitting: Internal Medicine

## 2013-07-17 NOTE — Telephone Encounter (Signed)
Patient is requesting 90 day supply daxazusin (generic) [facility]

## 2013-07-17 NOTE — Telephone Encounter (Signed)
A 90 day supply was sent on 06/22/13. Pt needs to see PCP for further refills. Left detailed mess informing pt.

## 2013-07-18 ENCOUNTER — Encounter: Payer: Self-pay | Admitting: Internal Medicine

## 2013-07-20 ENCOUNTER — Other Ambulatory Visit: Payer: Self-pay | Admitting: *Deleted

## 2013-07-20 MED ORDER — DOXAZOSIN MESYLATE 4 MG PO TABS: 2.0000 mg | ORAL_TABLET | Freq: Every day | ORAL | Status: DC

## 2013-08-10 ENCOUNTER — Telehealth: Payer: Self-pay | Admitting: Internal Medicine

## 2013-08-10 NOTE — Telephone Encounter (Signed)
Error

## 2013-08-11 ENCOUNTER — Encounter: Payer: Self-pay | Admitting: Internal Medicine

## 2013-09-16 DIAGNOSIS — I8390 Asymptomatic varicose veins of unspecified lower extremity: Secondary | ICD-10-CM | POA: Insufficient documentation

## 2013-09-25 ENCOUNTER — Ambulatory Visit (INDEPENDENT_AMBULATORY_CARE_PROVIDER_SITE_OTHER): Payer: BC Managed Care – PPO | Admitting: Internal Medicine

## 2013-09-25 ENCOUNTER — Encounter: Payer: Self-pay | Admitting: Internal Medicine

## 2013-09-25 ENCOUNTER — Other Ambulatory Visit (INDEPENDENT_AMBULATORY_CARE_PROVIDER_SITE_OTHER): Payer: BC Managed Care – PPO

## 2013-09-25 VITALS — BP 103/66 | HR 60 | Temp 97.5°F | Ht 72.0 in | Wt 193.0 lb

## 2013-09-25 DIAGNOSIS — Z87898 Personal history of other specified conditions: Secondary | ICD-10-CM

## 2013-09-25 DIAGNOSIS — Z0000 Encounter for general adult medical examination without abnormal findings: Secondary | ICD-10-CM

## 2013-09-25 DIAGNOSIS — R7309 Other abnormal glucose: Secondary | ICD-10-CM | POA: Insufficient documentation

## 2013-09-25 DIAGNOSIS — M25512 Pain in left shoulder: Secondary | ICD-10-CM | POA: Insufficient documentation

## 2013-09-25 DIAGNOSIS — M25519 Pain in unspecified shoulder: Secondary | ICD-10-CM

## 2013-09-25 LAB — HEPATIC FUNCTION PANEL
ALK PHOS: 59 U/L (ref 39–117)
ALT: 17 U/L (ref 0–53)
AST: 17 U/L (ref 0–37)
Albumin: 4.3 g/dL (ref 3.5–5.2)
BILIRUBIN DIRECT: 0.2 mg/dL (ref 0.0–0.3)
BILIRUBIN TOTAL: 1.1 mg/dL (ref 0.2–1.2)
Total Protein: 7 g/dL (ref 6.0–8.3)

## 2013-09-25 LAB — CBC WITH DIFFERENTIAL/PLATELET
Basophils Absolute: 0 10*3/uL (ref 0.0–0.1)
Basophils Relative: 0.6 % (ref 0.0–3.0)
EOS PCT: 2.4 % (ref 0.0–5.0)
Eosinophils Absolute: 0.1 10*3/uL (ref 0.0–0.7)
HCT: 41.4 % (ref 39.0–52.0)
Hemoglobin: 14.2 g/dL (ref 13.0–17.0)
LYMPHS PCT: 28.7 % (ref 12.0–46.0)
Lymphs Abs: 1.4 10*3/uL (ref 0.7–4.0)
MCHC: 34.3 g/dL (ref 30.0–36.0)
MCV: 89.8 fl (ref 78.0–100.0)
Monocytes Absolute: 0.5 10*3/uL (ref 0.1–1.0)
Monocytes Relative: 10.7 % (ref 3.0–12.0)
Neutro Abs: 2.8 10*3/uL (ref 1.4–7.7)
Neutrophils Relative %: 57.6 % (ref 43.0–77.0)
PLATELETS: 137 10*3/uL — AB (ref 150.0–400.0)
RBC: 4.61 Mil/uL (ref 4.22–5.81)
RDW: 13.4 % (ref 11.5–15.5)
WBC: 4.8 10*3/uL (ref 4.0–10.5)

## 2013-09-25 LAB — BASIC METABOLIC PANEL
BUN: 20 mg/dL (ref 6–23)
CHLORIDE: 105 meq/L (ref 96–112)
CO2: 30 mEq/L (ref 19–32)
CREATININE: 1 mg/dL (ref 0.4–1.5)
Calcium: 9.3 mg/dL (ref 8.4–10.5)
GFR: 79.92 mL/min (ref >60.00)
Glucose, Bld: 93 mg/dL (ref 70–99)
Potassium: 4.5 mEq/L (ref 3.5–5.1)
Sodium: 139 mEq/L (ref 135–145)

## 2013-09-25 LAB — LIPID PANEL
CHOL/HDL RATIO: 4
Cholesterol: 146 mg/dL (ref 0–200)
HDL: 35.3 mg/dL — ABNORMAL LOW (ref >39.00)
LDL CALC: 82 mg/dL (ref 0–99)
NonHDL: 110.7
Triglycerides: 142 mg/dL (ref 0.0–149.0)
VLDL: 28.4 mg/dL (ref 0.0–40.0)

## 2013-09-25 LAB — URINALYSIS
BILIRUBIN URINE: NEGATIVE
HGB URINE DIPSTICK: NEGATIVE
Ketones, ur: NEGATIVE
Leukocytes, UA: NEGATIVE
Nitrite: NEGATIVE
PH: 5.5 (ref 5.0–8.0)
Specific Gravity, Urine: 1.025 (ref 1.000–1.030)
Total Protein, Urine: NEGATIVE
Urine Glucose: NEGATIVE
Urobilinogen, UA: 0.2 (ref 0.0–1.0)

## 2013-09-25 LAB — TSH: TSH: 1.48 u[IU]/mL (ref 0.35–4.50)

## 2013-09-25 LAB — PSA: PSA: 0.67 ng/mL (ref 0.10–4.00)

## 2013-09-25 LAB — HEMOGLOBIN A1C: HEMOGLOBIN A1C: 5.3 % (ref 4.6–6.5)

## 2013-09-25 MED ORDER — DOXAZOSIN MESYLATE 4 MG PO TABS: 4.0000 mg | ORAL_TABLET | Freq: Every day | ORAL | Status: DC

## 2013-09-25 NOTE — Assessment & Plan Note (Signed)
Labs

## 2013-09-25 NOTE — Assessment & Plan Note (Signed)
8/15 MSK - rot cuff strain ROM exercises PT, inj offered

## 2013-09-25 NOTE — Progress Notes (Signed)
Subjective:    Patient ID: Richard [name], male    DOB: 1951-02-04, 63 y.o.   MRN: [id number]  HPI  The patient is here for a wellness exam. 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   The patient has been doing well overall without major physical or psychological issues going on lately.  C/o L shoulder pain w/ROM x2 mo  BP Readings from Last 3 Encounters:  09/25/13 103/66  02/16/12 140/76  01/18/12 110/60   Wt Readings from Last 3 Encounters:  09/25/13 193 lb (87.544 kg)  02/16/12 207 lb 9.6 oz (94.167 kg)  01/28/12 207 lb 9.6 oz (94.167 kg)       Review of Systems  Constitutional: Negative for appetite change, fatigue and unexpected weight change.  HENT: Negative for congestion, nosebleeds, sneezing, sore throat and trouble swallowing.   Eyes: Negative for itching and visual disturbance.  Respiratory: Negative for cough.   Cardiovascular: Negative for chest pain, palpitations and leg swelling.  Gastrointestinal: Negative for nausea, diarrhea, blood in stool and abdominal distention.  Genitourinary: Negative for frequency and hematuria.  Musculoskeletal: Negative for back pain, gait problem, joint swelling and neck pain.  Skin: Negative for rash.  Neurological: Negative for dizziness, tremors, speech difficulty and weakness.  Psychiatric/Behavioral: Negative for sleep disturbance, dysphoric mood and agitation. The patient is not nervous/anxious.        Objective:   Physical Exam  Constitutional: He is oriented to person, place, and time. He appears well-developed and well-nourished. No distress.  HENT:  Head: Normocephalic and atraumatic.  Right Ear: External ear normal.  Left Ear: External ear normal.  Nose: Nose normal.  Mouth/Throat: Oropharynx is clear and moist. No oropharyngeal exudate.  Eyes: Conjunctivae and EOM are normal. Pupils are equal, round, and reactive to light. Right eye exhibits no discharge. Left eye exhibits no discharge. No scleral icterus.  Neck:  Normal range of motion. Neck supple. No JVD present. No tracheal deviation present. No thyromegaly present.  Cardiovascular: Normal rate, regular rhythm, normal heart sounds and intact distal pulses.  Exam reveals no gallop and no friction rub.   No murmur heard. Pulmonary/Chest: Effort normal and breath sounds normal. No stridor. No respiratory distress. He has no wheezes. He has no rales. He exhibits no tenderness.  Abdominal: Soft. Bowel sounds are normal. He exhibits no distension and no mass. There is no tenderness. There is no rebound and no guarding.  Genitourinary: Rectum normal and penis normal. Guaiac negative stool. No penile tenderness.  1+ prostate  Musculoskeletal: Normal range of motion. He exhibits tenderness. He exhibits no edema.  L shoulder  Lymphadenopathy:    He has no cervical adenopathy.  Neurological: He is alert and oriented to person, place, and time. He has normal reflexes. No cranial nerve deficit. He exhibits normal muscle tone. Coordination normal.  Skin: Skin is warm and dry. No rash noted. He is not diaphoretic. No erythema. No pallor.  Psychiatric: He has a normal mood and affect. His behavior is normal. Judgment and thought content normal.    Lab Results  Component Value Date   GLUCOSE 96 04/16/2011   CHOL 153 04/16/2011   TRIG 262.0* 04/16/2011   HDL 35.30* 04/16/2011   LDLDIRECT 80.9 04/16/2011   ALT 20 04/16/2011   ALT 20 04/16/2011   AST 21 04/16/2011   AST 21 04/16/2011   NA 139 04/16/2011   K 4.2 04/16/2011   CL 105 04/16/2011   CREATININE 1.0 04/16/2011   BUN 19 04/16/2011  CO2 28 04/16/2011   TSH 1.47 04/16/2011   PSA 0.45 03/26/2006   HGBA1C 5.2 03/26/2006         Assessment & Plan:

## 2013-09-25 NOTE — Assessment & Plan Note (Signed)
Continue with current prescription therapy as reflected on the Med list.  

## 2013-09-25 NOTE — Progress Notes (Signed)
Pre visit review using our clinic review tool, if applicable. No additional management support is needed unless otherwise documented below in the visit note. 

## 2013-09-25 NOTE — Assessment & Plan Note (Signed)
We discussed age appropriate health related issues, including available/recomended screening tests and vaccinations. We discussed a need for adhering to healthy diet and exercise. Labs/EKG were reviewed/ordered. All questions were answered.   

## 2014-02-01 ENCOUNTER — Telehealth: Payer: Self-pay | Admitting: *Deleted

## 2014-02-01 NOTE — Telephone Encounter (Signed)
[facility] - Client [place] Patient Name: Richard [name] Gender: Male DOB: 1950/03/25 Age: 63 Y 18 M 3 D Return Phone Number: [phone] (Primary) Address: [address] City/State/Zip: [place] [address] Client [facility] - Client Client Site [place] - Day Physician Plotnikov, Alex Contact Type Call Call Type Triage / Clinical Caller Name Christie Copley Relationship To Patient Spouse Return Phone Number [phone] (Primary) Chief Complaint Sore Throat Initial Comment Caller states husband has sinus infection over week; sore throat; drainage; temp is 100-101, cough; PreDisposition Did not know what to do Nurse Assessment Nurse: Vallery Sa, RN, [place] Date/Time (Eastern Time): 01/31/2014 11:40:02 AM Confirm and document reason for call. If symptomatic, describe symptoms. ---Caller states he developed sinus pain/congestion, sore throat and cough about 10 days ago. He developed fever 4-5 days ago (99.0-100.5 oral). No wheezing. No severe breathing difficulty. Has the patient traveled out of the country within the last 30 days? ---No Does the patient require triage? ---Yes Related visit to physician within the last 2 weeks? ---No Does the PT have any chronic conditions? (i.e. diabetes, asthma, etc.) ---Yes List chronic conditions. ---Enlarged prostate Guidelines Guideline Title Affirmed Question Affirmed Notes Nurse Date/Time Eilene Ghazi Time) Sinus Pain or Congestion Fever present > 3 days (72 hours) [place], RN, [facility] 01/31/2014 11:43:05 AM Sore Throat Earache also present Trumbull, RN, Cathy 01/31/2014 11:45:54 AM Cough - Acute NonProductive SEVERE coughing spells (e.g., whooping sound after coughing, vomiting after coughing) Trumbull, RN, Cathy 01/31/2014 11:47:22 AM Disp. Time Eilene Ghazi Time) Disposition Final User 01/31/2014 11:45:23 AM See Physician within  [place], RN, [place] 01/31/2014 11:46:54 AM See Physician within [place], RN, T[place] 01/31/2014 11:49:10 AM See Physician within 24 Hours Yes Trumbull, RN, [place] PLEASE NOTE: All timestamps contained within this report are represented as [place] Standard Time. CONFIDENTIALTY NOTICE: This fax transmission is intended only for the addressee. It contains information that is legally privileged, confidential or otherwise protected from use or disclosure. If you are not the intended recipient, you are strictly prohibited from reviewing, disclosing, copying using or disseminating any of this information or taking any action in reliance on or regarding this information. If you have received this fax in error, please notify us immediately by telephone so that we can arrange for its return to [place]. Phone: [phone], Toll-Free: [phone], Fax: [phone] Page: 2 of 2 Call Id: [id number] [place] Understands: Yes Disagree/Comply: Comply Caller Understands: Yes Disagree/Comply: Personal assistant Understands: Yes Disagree/Comply: Comply Care Advice Given Per Guideline SEE PHYSICIAN WITHIN 24 HOURS: * IF OFFICE WILL BE OPEN: You need to be examined within the next 24 hours. Call your doctor when the office opens, and make an appointment. FOR A STUFFY NOSE - USE NASAL WASHES: * Introduction: Saline (salt water) nasal irrigation (nasal wash) is an effective and simple home remedy for treating stuffy nose and sinus congestion. The nose can be irrigated by pouring, spraying, or squirting salt water into the nose and then letting it run back out. PAIN OR FEVER MEDICINES: ACETAMINOPHEN (E.G., TYLENOL): CALL BACK IF: * Difficulty breathing (and not relieved by cleaning out nose) * You become worse. CARE ADVICE given per Sinus Pain or Congestion (Adult) guideline. SEE PHYSICIAN WITHIN 24 HOURS: * IF OFFICE WILL BE OPEN: You need to be examined within the next 24 hours. Call your doctor when the office  opens, and make an appointment. SORE THROAT - For relief of sore throat: *  Sip warm chicken broth or apple juice. PAIN OR FEVER MEDICINES: ACETAMINOPHEN (E.G., TYLENOL): SOFT DIET: * Eat a soft diet. Cold drinks, popsicles, and milk shakes are especially good. Avoid citrus fruits. CALL BACK IF: * You become worse. CARE ADVICE given per Sore Throat (Adult) guideline. SEE PHYSICIAN WITHIN 24 HOURS: * IF OFFICE WILL BE OPEN: You need to be examined within the next 24 hours. Call your doctor when the office opens, and make an appointment. COUGHING SPELLS: * Drink warm fluids. Inhale warm mist. (Reason: both relax the airway and loosen up the phlegm) * Suck on cough drops or hard candy to coat the irritated throat. HUMIDIFIER: If the air is dry, use a humidifier in the bedroom. (Reason: dry air makes coughs worse) AVOID TOBACCO SMOKE: Smoking or being exposed to smoke makes coughs much worse. CALL BACK IF: * Difficulty breathing occurs * You become worse. CARE ADVICE given per Cough - Acute Non-Productive (Adult) guideline. After Care Instructions Given Call Event Type User Date / Time Description Comments User: Berton Mount, RN Date/Time Eilene Ghazi Time): 01/31/2014 11:50:30 AM [place] states he will call the office back later to check on appointment options.

## 2014-08-12 ENCOUNTER — Other Ambulatory Visit: Payer: Self-pay | Admitting: Internal Medicine

## 2014-09-18 DIAGNOSIS — D229 Melanocytic nevi, unspecified: Secondary | ICD-10-CM | POA: Insufficient documentation

## 2014-11-10 ENCOUNTER — Other Ambulatory Visit: Payer: Self-pay | Admitting: Internal Medicine

## 2014-11-12 ENCOUNTER — Telehealth: Payer: Self-pay | Admitting: Internal Medicine

## 2014-11-12 NOTE — Telephone Encounter (Signed)
Patient Name: MURAD STAPLES DOB: Dec 22, 1950 Initial Comment Caller states her husband has a mole he thinks will have to be removed. Nurse Assessment Nurse: Vallery Sa, RN, C[place] Date/Time (Eastern Time): 11/12/2014 12:36:53 PM Confirm and document reason for call. If symptomatic, describe symptoms. ---Caller states her husband developed a mole on his thigh about two months ago that they think will need to be removed. No fever. Has the patient traveled out of the country within the last 30 days? ---No Does the patient require triage? ---Yes Related visit to physician within the last 2 weeks? ---No Does the PT have any chronic conditions? (i.e. diabetes, asthma, etc.) ---Yes List chronic conditions. ---Prostate problems Guidelines Guideline Title Affirmed Question Affirmed Notes Skin Lesion - Moles or Growths Caller cannot describe it clearly Final Disposition User See PCP When Office is Open (within 3 days) Vallery Sa, RN, [place] Comments Scheduled appointment with Dr. Alain Marion at 3:34pm 11/14/14. Disagree/Comply: Comply

## 2014-11-14 ENCOUNTER — Ambulatory Visit (INDEPENDENT_AMBULATORY_CARE_PROVIDER_SITE_OTHER): Payer: BLUE CROSS/BLUE SHIELD | Admitting: Internal Medicine

## 2014-11-14 ENCOUNTER — Encounter: Payer: Self-pay | Admitting: Internal Medicine

## 2014-11-14 VITALS — BP 102/78 | HR 75 | Wt 201.0 lb

## 2014-11-14 DIAGNOSIS — N400 Enlarged prostate without lower urinary tract symptoms: Secondary | ICD-10-CM | POA: Insufficient documentation

## 2014-11-14 DIAGNOSIS — E291 Testicular hypofunction: Secondary | ICD-10-CM | POA: Diagnosis not present

## 2014-11-14 DIAGNOSIS — D485 Neoplasm of uncertain behavior of skin: Secondary | ICD-10-CM | POA: Diagnosis not present

## 2014-11-14 DIAGNOSIS — D171 Benign lipomatous neoplasm of skin and subcutaneous tissue of trunk: Secondary | ICD-10-CM | POA: Insufficient documentation

## 2014-11-14 DIAGNOSIS — Z23 Encounter for immunization: Secondary | ICD-10-CM | POA: Diagnosis not present

## 2014-11-14 MED ORDER — TADALAFIL 5 MG PO TABS: 5.0000 mg | ORAL_TABLET | Freq: Every day | ORAL | Status: DC

## 2014-11-14 NOTE — Patient Instructions (Addendum)
There are natural ways to boost your testosterone:  1. Lose Weight If you're overweight, shedding the excess pounds may increase your testosterone levels, according to multiple research. Overweight men are more likely to have low testosterone levels to begin with, so this is an important trick to increase your body's testosterone production when you need it most.   2. Strength Training    Strength training is also known to boost testosterone levels, provided you are doing so intensely enough. When strength training to boost testosterone, you'll want to increase the weight and lower your number of reps, and then focus on exercises that work a large number of muscles.  3. Optimize Your Vitamin D Levels Vitamin D, a steroid hormone, is essential for the healthy development of the nucleus of the sperm cell, and helps maintain semen quality and sperm count. Vitamin D also increases levels of testosterone, which may boost libido. In one study, overweight men who were given vitamin D supplements had a significant increase in testosterone levels after one year.  4. Reduce Stress When you're under a lot of stress, your body releases high levels of the stress hormone cortisol. This hormone actually blocks the effects of testosterone, presumably because, from a biological standpoint, testosterone-associated behaviors (mating, competing, aggression) may have lowered your chances of survival in an emergency (hence, the "fight or flight" response is dominant, courtesy of cortisol).  5. Limit or Eliminate Sugar from Your Diet Testosterone levels decrease after you eat sugar, which is likely because the sugar leads to a high insulin level, another factor leading to low testosterone.  6. Eat Healthy Fats By healthy, this means not only mon- and polyunsaturated fats, like that found in avocadoes and nuts, but also saturated, as these are essential for building testosterone. Research shows that a diet with less than  40 percent of energy as fat (and that mainly from animal sources, i.e. saturated) lead to a decrease in testosterone levels.  It's important to understand that your body requires saturated fats from animal and vegetable sources (such as meat, dairy, certain oils, and tropical plants like coconut) for optimal functioning, and if you neglect this important food group in favor of sugar, grains and other starchy carbs, your health and weight are almost guaranteed to suffer. Examples of healthy fats you can eat more of to give your testosterone levels a boost include:  Olives and Olive oil  Coconuts and coconut oil Butter made from organic milk  Raw nuts, such as, almonds or pecans Eggs Avocados   Meats Palm oil Unheated organic nut oils   7. "Testosterone boosters" containing Vitamin D-3, Niacin, Vitamin B-6, Vitamin B-12, Magnesium, Zinc, Selenium, D-Aspartic Acid, Fenugreed Seed Extract, Oystershell, [place] Extract, [place] Ginseng may be helpful as well.            Postprocedure instructions :    A Band-Aid should be  changed twice daily. You can take a shower tomorrow.  Keep the wounds clean. You can wash them with liquid soap and water. Pat dry with gauze or a Kleenex tissue  Before applying antibiotic ointment and a Band-Aid.   You need to report immediately  if fever, chills or any signs of infection develop.    The biopsy results should be available in 1 -2 weeks.

## 2014-11-14 NOTE — Assessment & Plan Note (Signed)
Skin bx 

## 2014-11-14 NOTE — Progress Notes (Signed)
Pre visit review using our clinic review tool, if applicable. No additional management support is needed unless otherwise documented below in the visit note. 

## 2014-11-14 NOTE — Assessment & Plan Note (Signed)
Large L upper post chest Sug ref offered

## 2014-11-14 NOTE — Progress Notes (Signed)
Subjective:  Patient ID: Richard [name], male    DOB: Jun 15, 1950  Age: 64 y.o. MRN: [id number]  CC: No chief complaint on file.   HPI Richard [name] presents for low libido and ED. C/o BPH sx's. C/o moles  Outpatient Prescriptions Prior to Visit  Medication Sig Dispense Refill  . Calcium Carbonate-Vitamin D (CALCIUM + D PO) Take by mouth daily.    . Cholecalciferol (VITAMIN D) 2000 UNITS CAPS Take by mouth daily.    Marland Kitchen doxazosin (CARDURA) 4 MG tablet TAKE 1 TABLET DAILY 90 tablet 1  . Flaxseed, Linseed, (FLAXSEED OIL PO) Take by mouth. Takes 1400 mg daily    . Multiple Vitamin (MULTIVITAMIN) tablet Take 1 tablet by mouth daily.    . Thiamine HCl (VITAMIN B-1 PO) Take by mouth daily.    . vitamin C (ASCORBIC ACID) 500 MG tablet Take 500 mg by mouth daily.     No facility-administered medications prior to visit.    ROS Review of Systems  Constitutional: Negative for appetite change, fatigue and unexpected weight change.  HENT: Negative for congestion, nosebleeds, sneezing, sore throat and trouble swallowing.   Eyes: Negative for itching and visual disturbance.  Respiratory: Negative for cough.   Cardiovascular: Negative for chest pain, palpitations and leg swelling.  Gastrointestinal: Negative for nausea, diarrhea, blood in stool and abdominal distention.  Genitourinary: Positive for urgency, frequency, decreased urine volume and difficulty urinating. Negative for hematuria, penile swelling and genital sores.  Musculoskeletal: Negative for back pain, joint swelling, gait problem and neck pain.  Skin: Positive for color change. Negative for rash.  Neurological: Negative for dizziness, tremors, speech difficulty and weakness.  Psychiatric/Behavioral: Negative for suicidal ideas, sleep disturbance, dysphoric mood and agitation. The patient is not nervous/anxious.     Objective:  BP 102/78 mmHg  Pulse 75  Wt 201 lb (91.173 kg)  SpO2 97%  BP Readings from Last 3 Encounters:   11/14/14 102/78  09/25/13 103/66  02/16/12 140/76    Wt Readings from Last 3 Encounters:  11/14/14 201 lb (91.173 kg)  09/25/13 193 lb (87.544 kg)  02/16/12 207 lb 9.6 oz (94.167 kg)    Physical Exam  Constitutional: He is oriented to person, place, and time. He appears well-developed. No distress.  NAD  HENT:  Mouth/Throat: Oropharynx is clear and moist.  Eyes: Conjunctivae are normal. Pupils are equal, round, and reactive to light.  Neck: Normal range of motion. No JVD present. No thyromegaly present.  Cardiovascular: Normal rate, regular rhythm, normal heart sounds and intact distal pulses.  Exam reveals no gallop and no friction rub.   No murmur heard. Pulmonary/Chest: Effort normal and breath sounds normal. No respiratory distress. He has no wheezes. He has no rales. He exhibits no tenderness.  Abdominal: Soft. Bowel sounds are normal. He exhibits no distension and no mass. There is no tenderness. There is no rebound and no guarding.  Musculoskeletal: Normal range of motion. He exhibits no edema or tenderness.  Lymphadenopathy:    He has no cervical adenopathy.  Neurological: He is alert and oriented to person, place, and time. He has normal reflexes. No cranial nerve deficit. He exhibits normal muscle tone. He displays a negative Romberg sign. Coordination and gait normal.  Skin: Skin is warm and dry. No rash noted.  Psychiatric: He has a normal mood and affect. His behavior is normal. Judgment and thought content normal.   Moles    Procedure Note :     Procedure :  Skin biopsy   Indication:  Changing mole (s )   Risks including unsuccessful procedure , bleeding, infection, bruising, scar, a need for another complete procedure and others were explained to the patient in detail as well as the benefits. Informed consent was obtained and signed.   The patient was placed in a decubitus position.  Lesion #1 on  L prox anter thigh   measuring   6x4  mm   Skin over lesion  #1  was prepped with Betadine and alcohol  and anesthetized with 1/2 cc of 2% lidocaine and epinephrine, using a 25-gauge 1 inch needle.  Shave biopsy with a sterile Dermablade was carried out in the usual fashion. Hyfrecator was used to destroy the rest of the lesion potentially left behind and for hemostasis. Band-Aid was applied with antibiotic ointment.    Lesion #2 on   abd RLQ  measuring 4x4  mm   Skin over lesion #2  was prepped with Betadine and alcohol  and anesthetized with 1 cc of 2% lidocaine and epinephrine, using a 25-gauge 1 inch needle.  Shave biopsy with a sterile Dermablade was carried out in the usual fashion. Hyfrecator was used to destroy the rest of the lesion potentially left behind and for hemostasis. Band-Aid was applied with antibiotic ointment.   Tolerated well. Complications none.      Lab Results  Component Value Date   WBC 4.8 09/25/2013   HGB 14.2 09/25/2013   HCT 41.4 09/25/2013   PLT 137.0* 09/25/2013   GLUCOSE 93 09/25/2013   CHOL 146 09/25/2013   TRIG 142.0 09/25/2013   HDL 35.30* 09/25/2013   LDLDIRECT 80.9 04/16/2011   LDLCALC 82 09/25/2013   ALT 17 09/25/2013   AST 17 09/25/2013   NA 139 09/25/2013   K 4.5 09/25/2013   CL 105 09/25/2013   CREATININE 1.0 09/25/2013   BUN 20 09/25/2013   CO2 30 09/25/2013   TSH 1.48 09/25/2013   PSA 0.67 09/25/2013   HGBA1C 5.3 09/25/2013    No results found.  Assessment & Plan:   Diagnoses and all orders for this visit:  Need for influenza vaccination -     Flu Vaccine QUAD 36+ mos IM   I am having Mr. Gwyn maintain his multivitamin, Calcium Carbonate-Vitamin D (CALCIUM + D PO), Vitamin D, Thiamine HCl (VITAMIN B-1 PO), vitamin C, (Flaxseed, Linseed, (FLAXSEED OIL PO)), and doxazosin.  No orders of the defined types were placed in this encounter.     Follow-up: No Follow-up on file.  Walker Kehr, MD

## 2014-11-14 NOTE — Assessment & Plan Note (Signed)
See instructions

## 2014-11-15 ENCOUNTER — Other Ambulatory Visit (INDEPENDENT_AMBULATORY_CARE_PROVIDER_SITE_OTHER): Payer: BLUE CROSS/BLUE SHIELD

## 2014-11-15 DIAGNOSIS — N400 Enlarged prostate without lower urinary tract symptoms: Secondary | ICD-10-CM | POA: Diagnosis not present

## 2014-11-15 DIAGNOSIS — D485 Neoplasm of uncertain behavior of skin: Secondary | ICD-10-CM | POA: Diagnosis not present

## 2014-11-15 DIAGNOSIS — E291 Testicular hypofunction: Secondary | ICD-10-CM

## 2014-11-15 DIAGNOSIS — D171 Benign lipomatous neoplasm of skin and subcutaneous tissue of trunk: Secondary | ICD-10-CM

## 2014-11-15 LAB — URINALYSIS
BILIRUBIN URINE: NEGATIVE
HGB URINE DIPSTICK: NEGATIVE
KETONES UR: NEGATIVE
Leukocytes, UA: NEGATIVE
Nitrite: NEGATIVE
Specific Gravity, Urine: >=1.03 — AB (ref 1.000–1.030)
TOTAL PROTEIN, URINE-UPE24: NEGATIVE
URINE GLUCOSE: NEGATIVE
UROBILINOGEN UA: 0.2 (ref 0.0–1.0)
pH: 6 (ref 5.0–8.0)

## 2014-11-15 LAB — BASIC METABOLIC PANEL
BUN: 23 mg/dL (ref 6–23)
CALCIUM: 9 mg/dL (ref 8.4–10.5)
CO2: 28 mEq/L (ref 19–32)
Chloride: 107 mEq/L (ref 96–112)
Creatinine, Ser: 0.91 mg/dL (ref 0.40–1.50)
GFR: 88.79 mL/min (ref >60.00)
GLUCOSE: 105 mg/dL — AB (ref 70–99)
Potassium: 4.2 mEq/L (ref 3.5–5.1)
SODIUM: 141 meq/L (ref 135–145)

## 2014-11-15 LAB — CBC WITH DIFFERENTIAL/PLATELET
BASOS ABS: 0 10*3/uL (ref 0.0–0.1)
Basophils Relative: 0.4 % (ref 0.0–3.0)
EOS ABS: 0.1 10*3/uL (ref 0.0–0.7)
Eosinophils Relative: 1.7 % (ref 0.0–5.0)
HCT: 40 % (ref 39.0–52.0)
Hemoglobin: 13.7 g/dL (ref 13.0–17.0)
LYMPHS ABS: 1 10*3/uL (ref 0.7–4.0)
Lymphocytes Relative: 19.7 % (ref 12.0–46.0)
MCHC: 34.3 g/dL (ref 30.0–36.0)
MCV: 89.4 fl (ref 78.0–100.0)
MONO ABS: 0.5 10*3/uL (ref 0.1–1.0)
MONOS PCT: 9.6 % (ref 3.0–12.0)
NEUTROS PCT: 68.6 % (ref 43.0–77.0)
Neutro Abs: 3.4 10*3/uL (ref 1.4–7.7)
Platelets: 132 10*3/uL — ABNORMAL LOW (ref 150.0–400.0)
RBC: 4.47 Mil/uL (ref 4.22–5.81)
RDW: 12.8 % (ref 11.5–15.5)
WBC: 4.9 10*3/uL (ref 4.0–10.5)

## 2014-11-15 LAB — LIPID PANEL
CHOL/HDL RATIO: 4
Cholesterol: 141 mg/dL (ref 0–200)
HDL: 35.5 mg/dL — ABNORMAL LOW (ref >39.00)
LDL Cholesterol: 76 mg/dL (ref 0–99)
NONHDL: 105.39
Triglycerides: 145 mg/dL (ref 0.0–149.0)
VLDL: 29 mg/dL (ref 0.0–40.0)

## 2014-11-15 LAB — HEPATIC FUNCTION PANEL
ALK PHOS: 57 U/L (ref 39–117)
ALT: 16 U/L (ref 0–53)
AST: 14 U/L (ref 0–37)
Albumin: 4 g/dL (ref 3.5–5.2)
BILIRUBIN DIRECT: 0.1 mg/dL (ref 0.0–0.3)
BILIRUBIN TOTAL: 0.7 mg/dL (ref 0.2–1.2)
Total Protein: 6.5 g/dL (ref 6.0–8.3)

## 2014-11-15 LAB — TESTOSTERONE: Testosterone: 379.49 ng/dL (ref 300.00–890.00)

## 2014-11-15 LAB — PSA: PSA: 0.57 ng/mL (ref 0.10–4.00)

## 2014-11-15 LAB — TSH: TSH: 1.2 u[IU]/mL (ref 0.35–4.50)

## 2015-02-13 ENCOUNTER — Ambulatory Visit: Payer: BLUE CROSS/BLUE SHIELD | Admitting: Internal Medicine

## 2015-03-20 ENCOUNTER — Ambulatory Visit: Payer: Self-pay | Admitting: Internal Medicine

## 2015-04-08 ENCOUNTER — Encounter: Payer: Self-pay | Admitting: Internal Medicine

## 2015-04-09 ENCOUNTER — Other Ambulatory Visit: Payer: Self-pay | Admitting: *Deleted

## 2015-04-09 MED ORDER — TADALAFIL 5 MG PO TABS: 5.0000 mg | ORAL_TABLET | Freq: Every day | ORAL | Status: DC

## 2015-05-11 ENCOUNTER — Other Ambulatory Visit: Payer: Self-pay | Admitting: Internal Medicine

## 2015-08-06 ENCOUNTER — Other Ambulatory Visit (INDEPENDENT_AMBULATORY_CARE_PROVIDER_SITE_OTHER): Payer: BLUE CROSS/BLUE SHIELD

## 2015-08-06 ENCOUNTER — Ambulatory Visit (INDEPENDENT_AMBULATORY_CARE_PROVIDER_SITE_OTHER): Payer: BLUE CROSS/BLUE SHIELD | Admitting: Internal Medicine

## 2015-08-06 ENCOUNTER — Encounter: Payer: Self-pay | Admitting: Internal Medicine

## 2015-08-06 VITALS — BP 90/68 | HR 67 | Ht 72.0 in | Wt 204.0 lb

## 2015-08-06 DIAGNOSIS — E785 Hyperlipidemia, unspecified: Secondary | ICD-10-CM

## 2015-08-06 DIAGNOSIS — R7309 Other abnormal glucose: Secondary | ICD-10-CM

## 2015-08-06 DIAGNOSIS — N320 Bladder-neck obstruction: Secondary | ICD-10-CM | POA: Diagnosis not present

## 2015-08-06 DIAGNOSIS — N400 Enlarged prostate without lower urinary tract symptoms: Secondary | ICD-10-CM | POA: Diagnosis not present

## 2015-08-06 DIAGNOSIS — Z0000 Encounter for general adult medical examination without abnormal findings: Secondary | ICD-10-CM

## 2015-08-06 DIAGNOSIS — Z23 Encounter for immunization: Secondary | ICD-10-CM

## 2015-08-06 LAB — URINALYSIS
Bilirubin Urine: NEGATIVE
Hgb urine dipstick: NEGATIVE
KETONES UR: NEGATIVE
LEUKOCYTES UA: NEGATIVE
Nitrite: NEGATIVE
PH: 6 (ref 5.0–8.0)
SPECIFIC GRAVITY, URINE: 1.015 (ref 1.000–1.030)
Total Protein, Urine: NEGATIVE
URINE GLUCOSE: NEGATIVE
Urobilinogen, UA: 0.2 (ref 0.0–1.0)

## 2015-08-06 LAB — CBC WITH DIFFERENTIAL/PLATELET
BASOS ABS: 0 10*3/uL (ref 0.0–0.1)
Basophils Relative: 0.2 % (ref 0.0–3.0)
EOS ABS: 0.1 10*3/uL (ref 0.0–0.7)
Eosinophils Relative: 1.8 % (ref 0.0–5.0)
HEMATOCRIT: 39.2 % (ref 39.0–52.0)
HEMOGLOBIN: 13.5 g/dL (ref 13.0–17.0)
LYMPHS PCT: 27.6 % (ref 12.0–46.0)
Lymphs Abs: 1.2 10*3/uL (ref 0.7–4.0)
MCHC: 34.5 g/dL (ref 30.0–36.0)
MCV: 89.5 fl (ref 78.0–100.0)
Monocytes Absolute: 0.5 10*3/uL (ref 0.1–1.0)
Monocytes Relative: 11.9 % (ref 3.0–12.0)
Neutro Abs: 2.6 10*3/uL (ref 1.4–7.7)
Neutrophils Relative %: 58.5 % (ref 43.0–77.0)
PLATELETS: 141 10*3/uL — AB (ref 150.0–400.0)
RBC: 4.38 Mil/uL (ref 4.22–5.81)
RDW: 13.3 % (ref 11.5–15.5)
WBC: 4.4 10*3/uL (ref 4.0–10.5)

## 2015-08-06 LAB — LIPID PANEL
CHOLESTEROL: 166 mg/dL (ref 0–200)
HDL: 36.1 mg/dL — AB (ref >39.00)
LDL CALC: 95 mg/dL (ref 0–99)
NonHDL: 129.64
TRIGLYCERIDES: 174 mg/dL — AB (ref 0.0–149.0)
Total CHOL/HDL Ratio: 5
VLDL: 34.8 mg/dL (ref 0.0–40.0)

## 2015-08-06 LAB — HEPATIC FUNCTION PANEL
ALBUMIN: 4.4 g/dL (ref 3.5–5.2)
ALT: 17 U/L (ref 0–53)
AST: 15 U/L (ref 0–37)
Alkaline Phosphatase: 53 U/L (ref 39–117)
BILIRUBIN TOTAL: 1 mg/dL (ref 0.2–1.2)
Bilirubin, Direct: 0.2 mg/dL (ref 0.0–0.3)
Total Protein: 6.9 g/dL (ref 6.0–8.3)

## 2015-08-06 LAB — BASIC METABOLIC PANEL
BUN: 20 mg/dL (ref 6–23)
CALCIUM: 9.3 mg/dL (ref 8.4–10.5)
CO2: 30 mEq/L (ref 19–32)
CREATININE: 1 mg/dL (ref 0.40–1.50)
Chloride: 103 mEq/L (ref 96–112)
GFR: 79.45 mL/min (ref >60.00)
Glucose, Bld: 102 mg/dL — ABNORMAL HIGH (ref 70–99)
Potassium: 4.4 mEq/L (ref 3.5–5.1)
SODIUM: 138 meq/L (ref 135–145)

## 2015-08-06 LAB — PSA: PSA: 0.74 ng/mL (ref 0.10–4.00)

## 2015-08-06 LAB — HEPATITIS C ANTIBODY: HCV Ab: NEGATIVE

## 2015-08-06 LAB — TSH: TSH: 1.52 u[IU]/mL (ref 0.35–4.50)

## 2015-08-06 MED ORDER — TADALAFIL 5 MG PO TABS: 5.0000 mg | ORAL_TABLET | Freq: Every day | ORAL | Status: DC

## 2015-08-06 MED ORDER — DOXAZOSIN MESYLATE 4 MG PO TABS: 4.0000 mg | ORAL_TABLET | Freq: Every day | ORAL | Status: DC

## 2015-08-06 NOTE — Assessment & Plan Note (Signed)
labs

## 2015-08-06 NOTE — Progress Notes (Signed)
Pre visit review using our clinic review tool, if applicable. No additional management support is needed unless otherwise documented below in the visit note. 

## 2015-08-06 NOTE — Assessment & Plan Note (Signed)

## 2015-08-06 NOTE — Patient Instructions (Signed)

## 2015-08-06 NOTE — Progress Notes (Signed)
Subjective:  Patient ID: Richard [name], male    DOB: 1950/07/28  Age: 65 y.o. MRN: [id number]  CC: No chief complaint on file.   HPI Richard [name] presents for a well exam  Outpatient Prescriptions Prior to Visit  Medication Sig Dispense Refill  . Calcium Carbonate-Vitamin D (CALCIUM + D PO) Take by mouth daily.    . Cholecalciferol (VITAMIN D) 2000 UNITS CAPS Take by mouth daily.    Marland Kitchen doxazosin (CARDURA) 4 MG tablet TAKE 1 TABLET DAILY 90 tablet 0  . Flaxseed, Linseed, (FLAXSEED OIL PO) Take by mouth. Takes 1400 mg daily    . Multiple Vitamin (MULTIVITAMIN) tablet Take 1 tablet by mouth daily.    . tadalafil (CIALIS) 5 MG tablet Take 1 tablet (5 mg total) by mouth daily. 90 tablet 3  . Thiamine HCl (VITAMIN B-1 PO) Take by mouth daily.    . vitamin C (ASCORBIC ACID) 500 MG tablet Take 500 mg by mouth daily.     No facility-administered medications prior to visit.    ROS Review of Systems  Constitutional: Negative for appetite change, fatigue and unexpected weight change.  HENT: Negative for congestion, nosebleeds, sneezing, sore throat and trouble swallowing.   Eyes: Negative for itching and visual disturbance.  Respiratory: Negative for cough.   Cardiovascular: Negative for chest pain, palpitations and leg swelling.  Gastrointestinal: Negative for nausea, diarrhea, blood in stool and abdominal distention.  Genitourinary: Negative for frequency and hematuria.  Musculoskeletal: Positive for arthralgias. Negative for back pain, joint swelling, gait problem and neck pain.  Skin: Negative for rash.  Neurological: Negative for dizziness, tremors, speech difficulty and weakness.  Psychiatric/Behavioral: Negative for sleep disturbance, dysphoric mood and agitation. The patient is not nervous/anxious.     Objective:  BP 90/68 mmHg  Pulse 67  Ht 6' (1.829 m)  Wt 204 lb (92.534 kg)  BMI 27.66 kg/m2  SpO2 97%  BP Readings from Last 3 Encounters:  08/06/15 90/68    11/14/14 102/78  09/25/13 103/66    Wt Readings from Last 3 Encounters:  08/06/15 204 lb (92.534 kg)  11/14/14 201 lb (91.173 kg)  09/25/13 193 lb (87.544 kg)    Physical Exam  Constitutional: He is oriented to person, place, and time. He appears well-developed and well-nourished. No distress.  HENT:  Head: Normocephalic and atraumatic.  Right Ear: External ear normal.  Left Ear: External ear normal.  Nose: Nose normal.  Mouth/Throat: Oropharynx is clear and moist. No oropharyngeal exudate.  Eyes: Conjunctivae and EOM are normal. Pupils are equal, round, and reactive to light. Right eye exhibits no discharge. Left eye exhibits no discharge. No scleral icterus.  Neck: Normal range of motion. Neck supple. No JVD present. No tracheal deviation present. No thyromegaly present.  Cardiovascular: Normal rate, regular rhythm, normal heart sounds and intact distal pulses.  Exam reveals no gallop and no friction rub.   No murmur heard. Pulmonary/Chest: Effort normal and breath sounds normal. No stridor. No respiratory distress. He has no wheezes. He has no rales. He exhibits no tenderness.  Abdominal: Soft. Bowel sounds are normal. He exhibits no distension and no mass. There is no tenderness. There is no rebound and no guarding.  Genitourinary: Rectum normal, prostate normal and penis normal. Guaiac negative stool. No penile tenderness.  Musculoskeletal: Normal range of motion. He exhibits no edema or tenderness.  Lymphadenopathy:    He has no cervical adenopathy.  Neurological: He is alert and oriented to person, place, and time.  He has normal reflexes. No cranial nerve deficit. He exhibits normal muscle tone. Coordination normal.  Skin: Skin is warm and dry. No rash noted. He is not diaphoretic. No erythema. No pallor.  Psychiatric: He has a normal mood and affect. His behavior is normal. Judgment and thought content normal.  prostate 1+  Lab Results  Component Value Date   WBC 4.9  11/15/2014   HGB 13.7 11/15/2014   HCT 40.0 11/15/2014   PLT 132.0* 11/15/2014   GLUCOSE 105* 11/15/2014   CHOL 141 11/15/2014   TRIG 145.0 11/15/2014   HDL 35.50* 11/15/2014   LDLDIRECT 80.9 04/16/2011   LDLCALC 76 11/15/2014   ALT 16 11/15/2014   AST 14 11/15/2014   NA 141 11/15/2014   K 4.2 11/15/2014   CL 107 11/15/2014   CREATININE 0.91 11/15/2014   BUN 23 11/15/2014   CO2 28 11/15/2014   TSH 1.20 11/15/2014   PSA 0.57 11/15/2014   HGBA1C 5.3 09/25/2013    No results found.  Assessment & Plan:   There are no diagnoses linked to this encounter. I am having Mr. Carrigg maintain his multivitamin, Calcium Carbonate-Vitamin D (CALCIUM + D PO), Vitamin D, Thiamine HCl (VITAMIN B-1 PO), vitamin C, (Flaxseed, Linseed, (FLAXSEED OIL PO)), tadalafil, and doxazosin.  No orders of the defined types were placed in this encounter.     Follow-up: No Follow-up on file.  Walker Kehr, MD

## 2015-08-06 NOTE — Addendum Note (Signed)
Addended by: Cresenciano Lick on: 08/06/2015 11:46 AM   Modules accepted: Orders

## 2015-08-06 NOTE — Assessment & Plan Note (Signed)
Doxazosin  [facility]

## 2015-08-11 ENCOUNTER — Other Ambulatory Visit: Payer: Self-pay | Admitting: Internal Medicine

## 2015-10-18 ENCOUNTER — Ambulatory Visit (INDEPENDENT_AMBULATORY_CARE_PROVIDER_SITE_OTHER): Payer: Medicare Other

## 2015-10-18 DIAGNOSIS — Z23 Encounter for immunization: Secondary | ICD-10-CM

## 2016-01-14 ENCOUNTER — Encounter: Payer: Self-pay | Admitting: Internal Medicine

## 2016-01-16 ENCOUNTER — Other Ambulatory Visit: Payer: Self-pay | Admitting: Internal Medicine

## 2016-01-16 DIAGNOSIS — D367 Benign neoplasm of other specified sites: Secondary | ICD-10-CM

## 2016-01-21 DIAGNOSIS — H2513 Age-related nuclear cataract, bilateral: Secondary | ICD-10-CM | POA: Diagnosis not present

## 2016-02-04 DIAGNOSIS — D171 Benign lipomatous neoplasm of skin and subcutaneous tissue of trunk: Secondary | ICD-10-CM | POA: Diagnosis not present

## 2016-02-05 ENCOUNTER — Ambulatory Visit: Payer: Self-pay | Admitting: Surgery

## 2016-02-05 NOTE — H&P (Signed)
History of Present Illness Richard [name]. Richard Deakins MD; 02/05/2016 10:47 AM) The patient is a 65 year old male who presents with a complaint of Mass. Referred by Dr. Tyrone Apple Plotnikov for evaluation of a large mass on the back  This is a 65 year old male who presents with a 15 year history of a slowly enlarging mass on the left side of his mid back. This has become fairly large and uncomfortable. His wife has been encouraging him for years to have this removed. However the patient just recently decided that it was uncomfortable enough to come to the doctor for this problem. This area has never become infected. No drainage from this area.   Past Surgical History Patsey Berthold, CMA; 02/04/2016 1:40 PM) Open Inguinal Hernia Surgery Left. Oral Surgery Tonsillectomy  Allergies Patsey Berthold, CMA; 02/04/2016 1:41 PM) Aspirin *ANALGESICS - NonNarcotic*  Medication History Patsey Berthold, CMA; 02/04/2016 1:43 PM) Calcium-Phosphorus-Vitamin D (Oral) Specific strength unknown - Active. Vitamin D2 (2000UNIT Tablet, Oral) Active. Doxazosin Mesylate (4MG  Tablet, Oral) Active. Flaxseed (Linseed) (1000MG  Capsule, Oral) Active. Multivitamin Adult (Oral) Active. Cialis (5MG  Tablet, Oral) Active. Thiamine HCl (100MG  Tablet, Oral) Active. Vitamin C (500MG  Tablet, Oral) Active. Medications Reconciled  Social History Patsey Berthold, CMA; 02/04/2016 1:40 PM) Caffeine use Coffee, Tea. No alcohol use No drug use Tobacco use Never smoker.  Family History Patsey Berthold, [place]; 02/04/2016 1:40 PM) Family history unknown First Degree Relatives  Other Problems Patsey Berthold, [place]; 02/04/2016 1:40 PM) Enlarged Prostate Inguinal Hernia Kidney Stone     Review of Systems Patsey Berthold CMA; 02/04/2016 1:40 PM) General Not Present- Appetite Loss, Chills, Fatigue, Fever, Night Sweats, Weight Gain and Weight Loss. Skin Not Present- Change in Wart/Mole, Dryness, Hives, Jaundice, New  Lesions, Non-Healing Wounds, Rash and Ulcer. HEENT Present- Wears glasses/contact lenses. Not Present- Earache, Hearing Loss, Hoarseness, Nose Bleed, Oral Ulcers, Ringing in the Ears, Seasonal Allergies, Sinus Pain, Sore Throat, Visual Disturbances and Yellow Eyes. Respiratory Not Present- Bloody sputum, Chronic Cough, Difficulty Breathing, Snoring and Wheezing. Breast Not Present- Breast Mass, Breast Pain, Nipple Discharge and Skin Changes. Cardiovascular Not Present- Chest Pain, Difficulty Breathing Lying Down, Leg Cramps, Palpitations, Rapid Heart Rate, Shortness of Breath and Swelling of Extremities. Gastrointestinal Not Present- Abdominal Pain, Bloating, Bloody Stool, Change in Bowel Habits, Chronic diarrhea, Constipation, Difficulty Swallowing, Excessive gas, Gets full quickly at meals, Hemorrhoids, Indigestion, Nausea, Rectal Pain and Vomiting. Male Genitourinary Present- Nocturia. Not Present- Blood in Urine, Change in Urinary Stream, Frequency, Impotence, Painful Urination, Urgency and Urine Leakage. Musculoskeletal Not Present- Back Pain, Joint Pain, Joint Stiffness, Muscle Pain, Muscle Weakness and Swelling of Extremities. Neurological Not Present- Decreased Memory, Fainting, Headaches, Numbness, Seizures, Tingling, Tremor, Trouble walking and Weakness. Psychiatric Not Present- Anxiety, Bipolar, Change in Sleep Pattern, Depression, Fearful and Frequent crying. Endocrine Not Present- Cold Intolerance, Excessive Hunger, Hair Changes, Heat Intolerance, Hot flashes and New Diabetes. Hematology Not Present- Blood Thinners, Easy Bruising, Excessive bleeding, Gland problems, HIV and Persistent Infections.  Vitals Patsey Berthold CMA; 02/04/2016 1:43 PM) 02/04/2016 1:43 PM Weight: 214.4 lb Height: 72in Height was reported by patient. Body Surface Area: 2.19 m Body Mass Index: 29.08 kg/m  Temp.: 97.38F  Pulse: 72 (Regular)  BP: 120/78 (Sitting, Left Arm,  Standard)      Physical Exam Rodman Key K. Aldon Hengst MD; 02/05/2016 10:48 AM)  The physical exam findings are as follows: Note:WDWN in NAD Eyes: Pupils equal, round; sclera anicteric HENT: Oral mucosa moist; good dentition Neck: No masses palpated, no thyromegaly Lungs: CTA bilaterally; normal  respiratory effort CV: Regular rate and rhythm; no murmurs; extremities well-perfused with no edema Abd: +bowel sounds, soft, non-tender, no palpable organomegaly; no palpable hernias Back: mid-back just to the left of midline - 8 cm smooth, soft subcutaneous mass; well-demarcated Skin: Warm, dry; no sign of jaundice Psychiatric - alert and oriented x 4; calm mood and affect    Assessment & Plan Rodman Key K. Calaya Gildner MD; 02/05/2016 10:44 AM)  LIPOMA OF BACK (D17.1) Impression: 8 cm subcutaneous - mid-back  Current Plans Schedule for Surgery - Excision of subcutaneous lipoma - mid-back. The surgical procedure has been discussed with the patient. Potential risks, benefits, alternative treatments, and expected outcomes have been explained. All of the patient's questions at this time have been answered. The likelihood of reaching the patient's treatment goal is good. The patient understand the proposed surgical procedure and wishes to proceed   Richard [name]. Georgette Dover, MD, [facility]  General/ Trauma Surgery  02/05/2016 10:49 AM

## 2016-02-14 ENCOUNTER — Other Ambulatory Visit: Payer: Self-pay

## 2016-02-14 ENCOUNTER — Encounter (HOSPITAL_BASED_OUTPATIENT_CLINIC_OR_DEPARTMENT_OTHER)
Admission: RE | Admit: 2016-02-14 | Discharge: 2016-02-14 | Disposition: A | Discharge status: Home / Self Care | Payer: Medicare Other | Source: Ambulatory Visit | Attending: Surgery | Admitting: Surgery

## 2016-02-14 ENCOUNTER — Encounter (HOSPITAL_BASED_OUTPATIENT_CLINIC_OR_DEPARTMENT_OTHER): Payer: Self-pay | Admitting: *Deleted

## 2016-02-14 DIAGNOSIS — I10 Essential (primary) hypertension: Secondary | ICD-10-CM | POA: Diagnosis not present

## 2016-02-14 DIAGNOSIS — Z01810 Encounter for preprocedural cardiovascular examination: Secondary | ICD-10-CM | POA: Diagnosis not present

## 2016-02-19 ENCOUNTER — Ambulatory Visit (HOSPITAL_BASED_OUTPATIENT_CLINIC_OR_DEPARTMENT_OTHER): Payer: Medicare Other | Admitting: Certified Registered"

## 2016-02-19 ENCOUNTER — Encounter (HOSPITAL_BASED_OUTPATIENT_CLINIC_OR_DEPARTMENT_OTHER)
Admission: RE | Disposition: A | Discharge status: Home / Self Care | Payer: Self-pay | Source: Ambulatory Visit | Attending: Surgery

## 2016-02-19 ENCOUNTER — Encounter (HOSPITAL_BASED_OUTPATIENT_CLINIC_OR_DEPARTMENT_OTHER): Payer: Self-pay | Admitting: Certified Registered"

## 2016-02-19 ENCOUNTER — Ambulatory Visit (HOSPITAL_BASED_OUTPATIENT_CLINIC_OR_DEPARTMENT_OTHER)
Admission: RE | Admit: 2016-02-19 | Discharge: 2016-02-19 | Disposition: A | Discharge status: Home / Self Care | Payer: Medicare Other | Source: Ambulatory Visit | Attending: Surgery | Admitting: Surgery

## 2016-02-19 DIAGNOSIS — M25512 Pain in left shoulder: Secondary | ICD-10-CM | POA: Diagnosis not present

## 2016-02-19 DIAGNOSIS — N400 Enlarged prostate without lower urinary tract symptoms: Secondary | ICD-10-CM | POA: Diagnosis not present

## 2016-02-19 DIAGNOSIS — Z87891 Personal history of nicotine dependence: Secondary | ICD-10-CM | POA: Diagnosis not present

## 2016-02-19 DIAGNOSIS — D171 Benign lipomatous neoplasm of skin and subcutaneous tissue of trunk: Secondary | ICD-10-CM | POA: Diagnosis not present

## 2016-02-19 DIAGNOSIS — M722 Plantar fascial fibromatosis: Secondary | ICD-10-CM | POA: Diagnosis not present

## 2016-02-19 HISTORY — DX: Nausea with vomiting, unspecified: R11.2

## 2016-02-19 HISTORY — PX: LIPOMA EXCISION: SHX5283

## 2016-02-19 HISTORY — DX: Other specified postprocedural states: Z98.890

## 2016-02-19 SURGERY — EXCISION LIPOMA
Anesthesia: General | Site: Back

## 2016-02-19 MED ORDER — HYDROMORPHONE HCL 1 MG/ML IJ SOLN
0.2500 mg | INTRAMUSCULAR | Status: DC | PRN
  Administered 2016-02-19: 0.5 mg via INTRAVENOUS

## 2016-02-19 MED ORDER — ARTIFICIAL TEARS OP OINT
TOPICAL_OINTMENT | OPHTHALMIC | Status: AC
  Filled 2016-02-19: qty 3.5

## 2016-02-19 MED ORDER — CEFAZOLIN SODIUM-DEXTROSE 2-4 GM/100ML-% IV SOLN
INTRAVENOUS | Status: AC
  Filled 2016-02-19: qty 100

## 2016-02-19 MED ORDER — CHLORHEXIDINE GLUCONATE CLOTH 2 % EX PADS: 6.0000 | MEDICATED_PAD | Freq: Once | CUTANEOUS | Status: DC

## 2016-02-19 MED ORDER — PROPOFOL 10 MG/ML IV BOLUS
INTRAVENOUS | Status: DC | PRN
  Administered 2016-02-19: 200 mg via INTRAVENOUS

## 2016-02-19 MED ORDER — LIDOCAINE 2% (20 MG/ML) 5 ML SYRINGE
INTRAMUSCULAR | Status: AC
  Filled 2016-02-19: qty 5

## 2016-02-19 MED ORDER — DEXAMETHASONE SODIUM PHOSPHATE 10 MG/ML IJ SOLN
INTRAMUSCULAR | Status: AC
  Filled 2016-02-19: qty 1

## 2016-02-19 MED ORDER — ONDANSETRON HCL 4 MG/2ML IJ SOLN: 4.0000 mg | Freq: Once | INTRAMUSCULAR | Status: DC | PRN

## 2016-02-19 MED ORDER — SUCCINYLCHOLINE CHLORIDE 200 MG/10ML IV SOSY
PREFILLED_SYRINGE | INTRAVENOUS | Status: AC
  Filled 2016-02-19: qty 10

## 2016-02-19 MED ORDER — BUPIVACAINE HCL (PF) 0.25 % IJ SOLN
INTRAMUSCULAR | Status: DC | PRN
  Administered 2016-02-19: 20 mL

## 2016-02-19 MED ORDER — LIDOCAINE HCL (CARDIAC) 20 MG/ML IV SOLN
INTRAVENOUS | Status: DC | PRN
  Administered 2016-02-19: 100 mg via INTRAVENOUS

## 2016-02-19 MED ORDER — FENTANYL CITRATE (PF) 100 MCG/2ML IJ SOLN
INTRAMUSCULAR | Status: AC
  Filled 2016-02-19: qty 2

## 2016-02-19 MED ORDER — LACTATED RINGERS IV SOLN
INTRAVENOUS | Status: DC
  Administered 2016-02-19 (×2): via INTRAVENOUS

## 2016-02-19 MED ORDER — MEPERIDINE HCL 25 MG/ML IJ SOLN: 6.2500 mg | INTRAMUSCULAR | Status: DC | PRN

## 2016-02-19 MED ORDER — ONDANSETRON HCL 4 MG/2ML IJ SOLN
INTRAMUSCULAR | Status: AC
  Filled 2016-02-19: qty 2

## 2016-02-19 MED ORDER — HYDROCODONE-ACETAMINOPHEN 5-325 MG PO TABS: 1.0000 | ORAL_TABLET | Freq: Four times a day (QID) | ORAL | 0 refills | Status: DC | PRN

## 2016-02-19 MED ORDER — EPHEDRINE 5 MG/ML INJ
INTRAVENOUS | Status: AC
  Filled 2016-02-19: qty 10

## 2016-02-19 MED ORDER — ARTIFICIAL TEARS OP OINT
TOPICAL_OINTMENT | OPHTHALMIC | Status: DC | PRN
  Administered 2016-02-19: 1 via OPHTHALMIC

## 2016-02-19 MED ORDER — ONDANSETRON HCL 4 MG/2ML IJ SOLN
INTRAMUSCULAR | Status: DC | PRN
  Administered 2016-02-19: 4 mg via INTRAVENOUS

## 2016-02-19 MED ORDER — SCOPOLAMINE 1 MG/3DAYS TD PT72: 1.0000 | MEDICATED_PATCH | Freq: Once | TRANSDERMAL | Status: DC | PRN

## 2016-02-19 MED ORDER — MIDAZOLAM HCL 2 MG/2ML IJ SOLN
1.0000 mg | INTRAMUSCULAR | Status: DC | PRN
  Administered 2016-02-19: 1 mg via INTRAVENOUS

## 2016-02-19 MED ORDER — DEXAMETHASONE SODIUM PHOSPHATE 4 MG/ML IJ SOLN
INTRAMUSCULAR | Status: DC | PRN
  Administered 2016-02-19: 10 mg via INTRAVENOUS

## 2016-02-19 MED ORDER — EPHEDRINE SULFATE 50 MG/ML IJ SOLN
INTRAMUSCULAR | Status: DC | PRN
  Administered 2016-02-19: 10 mg via INTRAVENOUS

## 2016-02-19 MED ORDER — HYDROMORPHONE HCL 1 MG/ML IJ SOLN
INTRAMUSCULAR | Status: AC
  Filled 2016-02-19: qty 1

## 2016-02-19 MED ORDER — SUCCINYLCHOLINE CHLORIDE 20 MG/ML IJ SOLN
INTRAMUSCULAR | Status: DC | PRN
  Administered 2016-02-19: 120 mg via INTRAVENOUS

## 2016-02-19 MED ORDER — MIDAZOLAM HCL 2 MG/2ML IJ SOLN
INTRAMUSCULAR | Status: AC
  Filled 2016-02-19: qty 2

## 2016-02-19 MED ORDER — CEFAZOLIN SODIUM-DEXTROSE 2-4 GM/100ML-% IV SOLN
2.0000 g | INTRAVENOUS | Status: AC
  Administered 2016-02-19: 2 g via INTRAVENOUS

## 2016-02-19 MED ORDER — FENTANYL CITRATE (PF) 100 MCG/2ML IJ SOLN
50.0000 ug | INTRAMUSCULAR | Status: DC | PRN
  Administered 2016-02-19: 100 ug via INTRAVENOUS

## 2016-02-19 SURGICAL SUPPLY — 45 items
APL SKNCLS STERI-STRIP NONHPOA (GAUZE/BANDAGES/DRESSINGS) ×1
BENZOIN TINCTURE PRP APPL 2/3 (GAUZE/BANDAGES/DRESSINGS) ×3 IMPLANT
BLADE CLIPPER SURG (BLADE) IMPLANT
BLADE SURG 15 STRL LF DISP TIS (BLADE) ×1 IMPLANT
BLADE SURG 15 STRL SS (BLADE) ×3
CANISTER SUCT 1200ML W/VALVE (MISCELLANEOUS) ×3 IMPLANT
CHLORAPREP W/TINT 26ML (MISCELLANEOUS) ×3 IMPLANT
CLOSURE WOUND 1/2 X4 (GAUZE/BANDAGES/DRESSINGS) ×1
COVER BACK TABLE 60X90IN (DRAPES) ×3 IMPLANT
COVER MAYO STAND STRL (DRAPES) ×3 IMPLANT
DECANTER SPIKE VIAL GLASS SM (MISCELLANEOUS) IMPLANT
DRAPE LAPAROTOMY 100X72 PEDS (DRAPES) ×3 IMPLANT
DRAPE UTILITY XL STRL (DRAPES) ×3 IMPLANT
DRSG TEGADERM 4X4.75 (GAUZE/BANDAGES/DRESSINGS) ×3 IMPLANT
ELECT COATED BLADE 2.86 ST (ELECTRODE) ×3 IMPLANT
ELECT REM PT RETURN 9FT ADLT (ELECTROSURGICAL) ×3
ELECTRODE REM PT RTRN 9FT ADLT (ELECTROSURGICAL) ×1 IMPLANT
GLOVE BIO SURGEON STRL SZ7 (GLOVE) ×3 IMPLANT
GLOVE BIOGEL PI IND STRL 7.0 (GLOVE) ×2 IMPLANT
GLOVE BIOGEL PI IND STRL 7.5 (GLOVE) ×1 IMPLANT
GLOVE BIOGEL PI INDICATOR 7.0 (GLOVE) ×4
GLOVE BIOGEL PI INDICATOR 7.5 (GLOVE) ×2
GLOVE ECLIPSE 6.5 STRL STRAW (GLOVE) ×3 IMPLANT
GOWN STRL REUS W/ TWL LRG LVL3 (GOWN DISPOSABLE) ×2 IMPLANT
GOWN STRL REUS W/TWL LRG LVL3 (GOWN DISPOSABLE) ×6
NEEDLE HYPO 25X1 1.5 SAFETY (NEEDLE) ×3 IMPLANT
NS IRRIG 1000ML POUR BTL (IV SOLUTION) ×3 IMPLANT
PACK BASIN DAY SURGERY FS (CUSTOM PROCEDURE TRAY) ×3 IMPLANT
PENCIL BUTTON HOLSTER BLD 10FT (ELECTRODE) ×3 IMPLANT
SPONGE GAUZE 2X2 8PLY STER LF (GAUZE/BANDAGES/DRESSINGS)
SPONGE GAUZE 2X2 8PLY STRL LF (GAUZE/BANDAGES/DRESSINGS) IMPLANT
SPONGE GAUZE 4X4 12PLY STER LF (GAUZE/BANDAGES/DRESSINGS) ×3 IMPLANT
STRIP CLOSURE SKIN 1/2X4 (GAUZE/BANDAGES/DRESSINGS) ×2 IMPLANT
SUT MON AB 4-0 PC3 18 (SUTURE) ×3 IMPLANT
SUT PROLENE 6 0 P 1 18 (SUTURE) IMPLANT
SUT SILK 2 0 FS (SUTURE) IMPLANT
SUT VIC AB 3-0 SH 27 (SUTURE)
SUT VIC AB 3-0 SH 27X BRD (SUTURE) IMPLANT
SUT VICRYL 3-0 CR8 SH (SUTURE) ×3 IMPLANT
SYR CONTROL 10ML LL (SYRINGE) ×3 IMPLANT
TOWEL OR 17X24 6PK STRL BLUE (TOWEL DISPOSABLE) ×3 IMPLANT
TOWEL OR NON WOVEN STRL DISP B (DISPOSABLE) ×3 IMPLANT
TUBE CONNECTING 20'X1/4 (TUBING) ×1
TUBE CONNECTING 20X1/4 (TUBING) ×2 IMPLANT
YANKAUER SUCT BULB TIP NO VENT (SUCTIONS) ×3 IMPLANT

## 2016-02-19 NOTE — Anesthesia Preprocedure Evaluation (Signed)
Anesthesia Evaluation  Patient identified by MRN, date of birth, ID band Patient awake    Reviewed: Allergy & Precautions, NPO status , Patient's Chart, lab work & pertinent test results  History of Anesthesia Complications (+) PONV  Airway Mallampati: I  TM Distance: >3 FB Neck ROM: Full    Dental   Pulmonary former smoker,    Pulmonary exam normal        Cardiovascular Normal cardiovascular exam     Neuro/Psych    GI/Hepatic   Endo/Other    Renal/GU      Musculoskeletal   Abdominal   Peds  Hematology   Anesthesia Other Findings   Reproductive/Obstetrics                             Anesthesia Physical Anesthesia Plan  ASA: II  Anesthesia Plan: General   Post-op Pain Management:    Induction: Intravenous  Airway Management Planned: Oral ETT  Additional Equipment:   Intra-op Plan:   Post-operative Plan: Extubation in OR  Informed Consent: I have reviewed the patients History and Physical, chart, labs and discussed the procedure including the risks, benefits and alternatives for the proposed anesthesia with the patient or authorized representative who has indicated his/her understanding and acceptance.     Plan Discussed with: CRNA and Surgeon  Anesthesia Plan Comments:         Anesthesia Quick Evaluation

## 2016-02-19 NOTE — Discharge Instructions (Signed)
[place] Office Phone Number [phone]  POST OP INSTRUCTIONS  Always review your discharge instruction sheet given to you by the facility where your surgery was performed.  IF YOU HAVE DISABILITY OR FAMILY LEAVE FORMS, YOU MUST BRING THEM TO THE OFFICE FOR PROCESSING.  DO NOT GIVE THEM TO YOUR DOCTOR.  1. A prescription for pain medication may be given to you upon discharge.  Take your pain medication as prescribed, if needed.  If narcotic pain medicine is not needed, then you may take acetaminophen (Tylenol) or ibuprofen (Advil) as needed. 2. Take your usually prescribed medications unless otherwise directed 3. If you need a refill on your pain medication, please contact your pharmacy.  They will contact our office to request authorization.  Prescriptions will not be filled after 5pm or on week-ends. 4. You should eat very light the first 24 hours after surgery, such as soup, crackers, pudding, etc.  Resume your normal diet the day after surgery. 5. Most patients will experience some swelling and bruising around the incision.  Ice packs will help.  Swelling and bruising can take several days to resolve.  6. It is common to experience some constipation if taking pain medication after surgery.  Increasing fluid intake and taking a stool softener will usually help or prevent this problem from occurring.  A mild laxative (Milk of Magnesia or Miralax) should be taken according to package directions if there are no bowel movements after 48 hours. 7. Unless discharge instructions indicate otherwise, you may remove your bandages 24-48 hours after surgery, and you may shower at that time.  You may have steri-strips (small skin tapes) in place directly over the incision.  These strips should be left on the skin for 7-10 days.  8. ACTIVITIES:  You may resume regular daily activities (gradually increasing) beginning the next day.    You may have sexual intercourse when it is  comfortable. a. You may drive when you no longer are taking prescription pain medication, you can comfortably wear a seatbelt, and you can safely maneuver your car and apply brakes. b. RETURN TO WORK:  ______________________________________________________________________________________ 9. You should see your doctor in the office for a follow-up appointment approximately two weeks after your surgery.   10. OTHER INSTRUCTIONS: _______________________________________________________________________________________________ _____________________________________________________________________________________________________________________________________ _____________________________________________________________________________________________________________________________________ _____________________________________________________________________________________________________________________________________  WHEN TO CALL YOUR DOCTOR: 1. Fever over 101.0 2. Nausea and/or vomiting. 3. Extreme swelling or bruising. 4. Continued bleeding from incision. 5. Increased pain, redness, or drainage from the incision.  The clinic staff is available to answer your questions during regular business hours.  Please dont hesitate to call and ask to speak to one of the nurses for clinical concerns.  If you have a medical emergency, go to the nearest emergency room or call 911.  A surgeon from [facility] is always on call at the hospital.  For further questions, please visit [email or web address]     Post Anesthesia Home Care Instructions  Activity: Get plenty of rest for the remainder of the day. A responsible adult should stay with you for 24 hours following the procedure.  For the next 24 hours, DO NOT: -Drive a car -Paediatric nurse -Drink alcoholic beverages -Take any medication unless instructed by your physician -Make any legal decisions or sign important  papers.  Meals: Start with liquid foods such as gelatin or soup. Progress to regular foods as tolerated. Avoid greasy, spicy, heavy foods. If nausea and/or vomiting occur, drink only clear liquids until the nausea and/or vomiting subsides. Call your  physician if vomiting continues.  Special Instructions/Symptoms: Your throat may feel dry or sore from the anesthesia or the breathing tube placed in your throat during surgery. If this causes discomfort, gargle with warm salt water. The discomfort should disappear within 24 hours.  If you had a scopolamine patch placed behind your ear for the management of post- operative nausea and/or vomiting:  1. The medication in the patch is effective for 72 hours, after which it should be removed.  Wrap patch in a tissue and discard in the trash. Wash hands thoroughly with soap and water. 2. You may remove the patch earlier than 72 hours if you experience unpleasant side effects which may include dry mouth, dizziness or visual disturbances. 3. Avoid touching the patch. Wash your hands with soap and water after contact with the patch.

## 2016-02-19 NOTE — Anesthesia Procedure Notes (Signed)
Procedure Name: Intubation Date/Time: 02/19/2016 12:00 PM Performed by: Baxter Flattery Pre-anesthesia Checklist: Patient identified, Emergency Drugs available, Suction available and Patient being monitored Patient Re-evaluated:Patient Re-evaluated prior to inductionOxygen Delivery Method: Circle system utilized Preoxygenation: Pre-oxygenation with 100% oxygen Intubation Type: IV induction Ventilation: Mask ventilation without difficulty Laryngoscope Size: Miller and 2 Grade View: Grade I Tube type: Oral Tube size: 8.0 mm Number of attempts: 1 Airway Equipment and Method: Stylet Placement Confirmation: ETT inserted through vocal cords under direct vision,  positive ETCO2 and breath sounds checked- equal and bilateral Secured at: 24 cm Tube secured with: Tape Dental Injury: Teeth and Oropharynx as per pre-operative assessment

## 2016-02-19 NOTE — Interval H&P Note (Signed)
History and Physical Interval Note:  02/19/2016 10:39 AM  [place]  has presented today for surgery, with the diagnosis of Subcutaneous mass mid back 8 cm  The various methods of treatment have been discussed with the patient and family. After consideration of risks, benefits and other options for treatment, the patient has consented to  Procedure(s): EXCISION SUBCUTANEOUS LIPOMA BACK (N/A) as a surgical intervention .  The patient's history has been reviewed, patient examined, no change in status, stable for surgery.  I have reviewed the patient's chart and labs.  Questions were answered to the patient's satisfaction.     Alejandro Gamel K.

## 2016-02-19 NOTE — Transfer of Care (Signed)
Immediate Anesthesia Transfer of Care Note  Patient: Richard [name]  Procedure(s) Performed: Procedure(s): EXCISION SUBCUTANEOUS LIPOMA BACK (N/A)  Patient Location: PACU  Anesthesia Type:General  Level of Consciousness: awake, alert  and patient cooperative  Airway & Oxygen Therapy: Patient Spontanous Breathing and Patient connected to face mask oxygen  Post-op Assessment: Report given to RN, Post -op Vital signs reviewed and stable and Patient moving all extremities  Post vital signs: Reviewed and stable  Last Vitals:  Vitals:   02/19/16 1046  BP: 113/71  Pulse: (!) 57  Resp: 18  Temp: 36.7 C    Last Pain:  Vitals:   02/19/16 1046  TempSrc: Oral         Complications: No apparent anesthesia complications

## 2016-02-19 NOTE — H&P (View-Only) (Signed)
History of Present Illness Imogene Burn. Johnta Couts MD; 02/05/2016 10:47 AM) The patient is a 66 year old male who presents with a complaint of Mass. Referred by Dr. Tyrone Apple Plotnikov for evaluation of a large mass on the back  This is a 66 year old male who presents with a 15 year history of a slowly enlarging mass on the left side of his mid back. This has become fairly large and uncomfortable. His wife has been encouraging him for years to have this removed. However the patient just recently decided that it was uncomfortable enough to come to the doctor for this problem. This area has never become infected. No drainage from this area.   Past Surgical History Patsey Berthold, CMA; 02/04/2016 1:40 PM) Open Inguinal Hernia Surgery Left. Oral Surgery Tonsillectomy  Allergies Patsey Berthold, CMA; 02/04/2016 1:41 PM) Aspirin *ANALGESICS - NonNarcotic*  Medication History Patsey Berthold, CMA; 02/04/2016 1:43 PM) Calcium-Phosphorus-Vitamin D (Oral) Specific strength unknown - Active. Vitamin D2 (2000UNIT Tablet, Oral) Active. Doxazosin Mesylate (4MG  Tablet, Oral) Active. Flaxseed (Linseed) (1000MG  Capsule, Oral) Active. Multivitamin Adult (Oral) Active. Cialis (5MG  Tablet, Oral) Active. Thiamine HCl (100MG  Tablet, Oral) Active. Vitamin C (500MG  Tablet, Oral) Active. Medications Reconciled  Social History Patsey Berthold, CMA; 02/04/2016 1:40 PM) Caffeine use Coffee, Tea. No alcohol use No drug use Tobacco use Never smoker.  Family History Patsey Berthold, [place]; 02/04/2016 1:40 PM) Family history unknown First Degree Relatives  Other Problems Patsey Berthold, [place]; 02/04/2016 1:40 PM) Enlarged Prostate Inguinal Hernia Kidney Stone     Review of Systems Patsey Berthold CMA; 02/04/2016 1:40 PM) General Not Present- Appetite Loss, Chills, Fatigue, Fever, Night Sweats, Weight Gain and Weight Loss. Skin Not Present- Change in Wart/Mole, Dryness, Hives, Jaundice, New  Lesions, Non-Healing Wounds, Rash and Ulcer. HEENT Present- Wears glasses/contact lenses. Not Present- Earache, Hearing Loss, Hoarseness, Nose Bleed, Oral Ulcers, Ringing in the Ears, Seasonal Allergies, Sinus Pain, Sore Throat, Visual Disturbances and Yellow Eyes. Respiratory Not Present- Bloody sputum, Chronic Cough, Difficulty Breathing, Snoring and Wheezing. Breast Not Present- Breast Mass, Breast Pain, Nipple Discharge and Skin Changes. Cardiovascular Not Present- Chest Pain, Difficulty Breathing Lying Down, Leg Cramps, Palpitations, Rapid Heart Rate, Shortness of Breath and Swelling of Extremities. Gastrointestinal Not Present- Abdominal Pain, Bloating, Bloody Stool, Change in Bowel Habits, Chronic diarrhea, Constipation, Difficulty Swallowing, Excessive gas, Gets full quickly at meals, Hemorrhoids, Indigestion, Nausea, Rectal Pain and Vomiting. Male Genitourinary Present- Nocturia. Not Present- Blood in Urine, Change in Urinary Stream, Frequency, Impotence, Painful Urination, Urgency and Urine Leakage. Musculoskeletal Not Present- Back Pain, Joint Pain, Joint Stiffness, Muscle Pain, Muscle Weakness and Swelling of Extremities. Neurological Not Present- Decreased Memory, Fainting, Headaches, Numbness, Seizures, Tingling, Tremor, Trouble walking and Weakness. Psychiatric Not Present- Anxiety, Bipolar, Change in Sleep Pattern, Depression, Fearful and Frequent crying. Endocrine Not Present- Cold Intolerance, Excessive Hunger, Hair Changes, Heat Intolerance, Hot flashes and New Diabetes. Hematology Not Present- Blood Thinners, Easy Bruising, Excessive bleeding, Gland problems, HIV and Persistent Infections.  Vitals Patsey Berthold CMA; 02/04/2016 1:43 PM) 02/04/2016 1:43 PM Weight: 214.4 lb Height: 72in Height was reported by patient. Body Surface Area: 2.19 m Body Mass Index: 29.08 kg/m  Temp.: 97.40F  Pulse: 72 (Regular)  BP: 120/78 (Sitting, Left Arm,  Standard)      Physical Exam Rodman Key K. Charlet Harr MD; 02/05/2016 10:48 AM)  The physical exam findings are as follows: Note:WDWN in NAD Eyes: Pupils equal, round; sclera anicteric HENT: Oral mucosa moist; good dentition Neck: No masses palpated, no thyromegaly Lungs: CTA bilaterally; normal  respiratory effort CV: Regular rate and rhythm; no murmurs; extremities well-perfused with no edema Abd: +bowel sounds, soft, non-tender, no palpable organomegaly; no palpable hernias Back: mid-back just to the left of midline - 8 cm smooth, soft subcutaneous mass; well-demarcated Skin: Warm, dry; no sign of jaundice Psychiatric - alert and oriented x 4; calm mood and affect    Assessment & Plan Rodman Key K. Herschell Virani MD; 02/05/2016 10:44 AM)  LIPOMA OF BACK (D17.1) Impression: 8 cm subcutaneous - mid-back  Current Plans Schedule for Surgery - Excision of subcutaneous lipoma - mid-back. The surgical procedure has been discussed with the patient. Potential risks, benefits, alternative treatments, and expected outcomes have been explained. All of the patient's questions at this time have been answered. The likelihood of reaching the patient's treatment goal is good. The patient understand the proposed surgical procedure and wishes to proceed   Imogene Burn. Georgette Dover, MD, [facility]  General/ Trauma Surgery  02/05/2016 10:49 AM

## 2016-02-19 NOTE — Op Note (Signed)
Preop diagnosis:  Subcutaneous lipoma left back (8 cm) Postop diagnosis: Same Procedure performed: Excision of subcutaneous lipoma left back Surgeon:Lorenza Winkleman K. Anesthesia Gen. Indications:  This is a 66 year old male who presents with a 15 year history of an enlarging mass on the left side of his back. This has become large and uncomfortable. The patient presents now for elective excision. This is felt to represent a subcutaneous lipoma.  Description of procedure:  The patient is brought to the operating room and placed in a supine position on the operating room table. After an adequate level of general anesthesia was obtained, he was moved to a lateral position on his right side. The beanbag was used to position a properly. He was secured to the table. The area over the mass on the left side of his back was then prepped with ChloraPrep and draped in sterile fashion. A timeout was taken to ensure the proper patient and proper procedure. We infiltrated the area over the mass with 0.25% Marcaine. A transverse incision was made across the midportion of the mass. Dissection was carried down through the dermis to the surface of the mass. The mass is fairly lobulated with multiple extensions into the subcutaneous tissue surrounding this area. It extends all way down to the underlying muscle. We dissected completely around the mass using a combination of blunt dissection and cautery. The mass was removed entirely. There was no residual lipoma left behind. The specimen was sent for pathologic examination. The wound was irrigated and inspected for hemostasis.  We closed the dead space with multiple interrupted 3-0 Vicryl to  tack down the dermis to the underlying muscle.  The dermis was closed with 3-0 Vicryl. 4 Monocryl was used to close skin. Steri-Strips and clean dressings were applied. Patient was admitted back to the supine position. He was extubated and brought to recovery room in stable condition. All  sponge, Schmidt, and needle counts are correct.  Imogene Burn. Georgette Dover, MD, [facility]  General/ Trauma Surgery  02/19/2016 1:03 PM

## 2016-02-19 NOTE — Anesthesia Postprocedure Evaluation (Signed)
Anesthesia Post Note  Patient: Richard [name]  Procedure(s) Performed: Procedure(s) (LRB): EXCISION SUBCUTANEOUS LIPOMA BACK (N/A)  Patient location during evaluation: PACU Anesthesia Type: General Level of consciousness: awake and alert Pain management: pain level controlled Vital Signs Assessment: post-procedure vital signs reviewed and stable Respiratory status: spontaneous breathing, nonlabored ventilation, respiratory function stable and patient connected to nasal cannula oxygen Cardiovascular status: blood pressure returned to baseline and stable Postop Assessment: no signs of nausea or vomiting Anesthetic complications: no       Last Vitals:  Vitals:   02/19/16 1330 02/19/16 1345  BP: 132/82 130/87  Pulse: 62 (!) 55  Resp: 15 10  Temp:      Last Pain:  Vitals:   02/19/16 1345  TempSrc:   PainSc: 1                  Damon Baisch DAVID

## 2016-02-20 ENCOUNTER — Encounter (HOSPITAL_BASED_OUTPATIENT_CLINIC_OR_DEPARTMENT_OTHER): Payer: Self-pay | Admitting: Surgery

## 2016-09-29 ENCOUNTER — Encounter: Payer: Self-pay | Admitting: Internal Medicine

## 2016-09-30 ENCOUNTER — Encounter: Payer: Self-pay | Admitting: Internal Medicine

## 2016-10-05 ENCOUNTER — Telehealth: Payer: Self-pay | Admitting: Internal Medicine

## 2016-10-05 NOTE — Telephone Encounter (Signed)
Ok x12 months Thx

## 2016-10-05 NOTE — Telephone Encounter (Signed)
Pt came by the office stating that he has a physical coming up on September 7th but will be out of his Tadalafil  5 MG. Can a refill of this be sent to [facility]?

## 2016-10-06 MED ORDER — TADALAFIL 5 MG PO TABS: 5.0000 mg | ORAL_TABLET | Freq: Every day | ORAL | 3 refills | Status: DC

## 2016-10-06 NOTE — Telephone Encounter (Signed)
Sent msg vis email that pt sent on 8/14. Rx has been faxed to [facility] @ [phone]....Richard [name]

## 2016-10-23 ENCOUNTER — Ambulatory Visit (INDEPENDENT_AMBULATORY_CARE_PROVIDER_SITE_OTHER): Payer: Medicare Other | Admitting: Internal Medicine

## 2016-10-23 ENCOUNTER — Encounter: Payer: Self-pay | Admitting: Internal Medicine

## 2016-10-23 ENCOUNTER — Other Ambulatory Visit (INDEPENDENT_AMBULATORY_CARE_PROVIDER_SITE_OTHER): Payer: Medicare Other

## 2016-10-23 VITALS — BP 120/76 | HR 55 | Temp 97.9°F | Ht 72.0 in | Wt 202.0 lb

## 2016-10-23 DIAGNOSIS — N320 Bladder-neck obstruction: Secondary | ICD-10-CM | POA: Diagnosis not present

## 2016-10-23 DIAGNOSIS — Z23 Encounter for immunization: Secondary | ICD-10-CM

## 2016-10-23 DIAGNOSIS — N400 Enlarged prostate without lower urinary tract symptoms: Secondary | ICD-10-CM | POA: Diagnosis not present

## 2016-10-23 DIAGNOSIS — Z0000 Encounter for general adult medical examination without abnormal findings: Secondary | ICD-10-CM

## 2016-10-23 DIAGNOSIS — E785 Hyperlipidemia, unspecified: Secondary | ICD-10-CM

## 2016-10-23 DIAGNOSIS — R21 Rash and other nonspecific skin eruption: Secondary | ICD-10-CM

## 2016-10-23 LAB — URINALYSIS
Bilirubin Urine: NEGATIVE
Hgb urine dipstick: NEGATIVE
KETONES UR: NEGATIVE
Leukocytes, UA: NEGATIVE
Nitrite: NEGATIVE
SPECIFIC GRAVITY, URINE: 1.02 (ref 1.000–1.030)
URINE GLUCOSE: NEGATIVE
UROBILINOGEN UA: 0.2 (ref 0.0–1.0)
pH: 6 (ref 5.0–8.0)

## 2016-10-23 LAB — LIPID PANEL
CHOLESTEROL: 163 mg/dL (ref 0–200)
HDL: 36.9 mg/dL — ABNORMAL LOW (ref >39.00)
LDL Cholesterol: 89 mg/dL (ref 0–99)
NonHDL: 125.84
TRIGLYCERIDES: 182 mg/dL — AB (ref 0.0–149.0)
Total CHOL/HDL Ratio: 4
VLDL: 36.4 mg/dL (ref 0.0–40.0)

## 2016-10-23 LAB — BASIC METABOLIC PANEL
BUN: 21 mg/dL (ref 6–23)
CO2: 31 meq/L (ref 19–32)
Calcium: 9.6 mg/dL (ref 8.4–10.5)
Chloride: 103 mEq/L (ref 96–112)
Creatinine, Ser: 0.98 mg/dL (ref 0.40–1.50)
GFR: 81.03 mL/min (ref >60.00)
GLUCOSE: 99 mg/dL (ref 70–99)
POTASSIUM: 4.1 meq/L (ref 3.5–5.1)
SODIUM: 140 meq/L (ref 135–145)

## 2016-10-23 LAB — CBC WITH DIFFERENTIAL/PLATELET
BASOS PCT: 0.7 % (ref 0.0–3.0)
Basophils Absolute: 0 10*3/uL (ref 0.0–0.1)
EOS ABS: 0.1 10*3/uL (ref 0.0–0.7)
Eosinophils Relative: 2 % (ref 0.0–5.0)
HCT: 39.2 % (ref 39.0–52.0)
HEMOGLOBIN: 13.8 g/dL (ref 13.0–17.0)
Lymphocytes Relative: 27.7 % (ref 12.0–46.0)
Lymphs Abs: 1.2 10*3/uL (ref 0.7–4.0)
MCHC: 35.2 g/dL (ref 30.0–36.0)
MCV: 92 fl (ref 78.0–100.0)
MONO ABS: 0.5 10*3/uL (ref 0.1–1.0)
Monocytes Relative: 11.9 % (ref 3.0–12.0)
NEUTROS ABS: 2.4 10*3/uL (ref 1.4–7.7)
Neutrophils Relative %: 57.7 % (ref 43.0–77.0)
PLATELETS: 126 10*3/uL — AB (ref 150.0–400.0)
RBC: 4.27 Mil/uL (ref 4.22–5.81)
RDW: 13.6 % (ref 11.5–15.5)
WBC: 4.2 10*3/uL (ref 4.0–10.5)

## 2016-10-23 LAB — HEPATIC FUNCTION PANEL
ALBUMIN: 4.5 g/dL (ref 3.5–5.2)
ALK PHOS: 58 U/L (ref 39–117)
ALT: 14 U/L (ref 0–53)
AST: 15 U/L (ref 0–37)
Bilirubin, Direct: 0.2 mg/dL (ref 0.0–0.3)
TOTAL PROTEIN: 6.9 g/dL (ref 6.0–8.3)
Total Bilirubin: 1.1 mg/dL (ref 0.2–1.2)

## 2016-10-23 LAB — PSA: PSA: 0.86 ng/mL (ref 0.10–4.00)

## 2016-10-23 LAB — TSH: TSH: 1.74 u[IU]/mL (ref 0.35–4.50)

## 2016-10-23 MED ORDER — DOXAZOSIN MESYLATE 4 MG PO TABS: 4.0000 mg | ORAL_TABLET | Freq: Every day | ORAL | 3 refills | Status: DC

## 2016-10-23 MED ORDER — TADALAFIL 5 MG PO TABS: 5.0000 mg | ORAL_TABLET | Freq: Every day | ORAL | 3 refills | Status: DC

## 2016-10-23 MED ORDER — TRIAMCINOLONE ACETONIDE 0.5 % EX OINT: 1.0000 "application " | TOPICAL_OINTMENT | Freq: Two times a day (BID) | CUTANEOUS | 1 refills | Status: AC

## 2016-10-23 NOTE — Assessment & Plan Note (Signed)
B dorsal feet Triamc oint

## 2016-10-23 NOTE — Assessment & Plan Note (Signed)

## 2016-10-23 NOTE — Assessment & Plan Note (Signed)
Doxazosin - take at night Cialis

## 2016-10-23 NOTE — Addendum Note (Signed)
Addended by: Aviva Signs M on: 10/23/2016 10:20 AM   Modules accepted: Orders

## 2016-10-23 NOTE — Patient Instructions (Signed)
Doxazosin - take at night     Health Maintenance, Male A healthy lifestyle and preventive care is important for your health and wellness. Ask your health care provider about what schedule of regular examinations is right for you. What should I know about weight and diet? Eat a Healthy Diet  Eat plenty of vegetables, fruits, whole grains, low-fat dairy products, and lean protein.  Do not eat a lot of foods high in solid fats, added sugars, or salt.  Maintain a Healthy Weight Regular exercise can help you achieve or maintain a healthy weight. You should:  Do at least 150 minutes of exercise each week. The exercise should increase your heart rate and make you sweat (moderate-intensity exercise).  Do strength-training exercises at least twice a week.  Watch Your Levels of Cholesterol and Blood Lipids  Have your blood tested for lipids and cholesterol every 5 years starting at 67 years of age. If you are at high risk for heart disease, you should start having your blood tested when you are 67 years old. You may need to have your cholesterol levels checked more often if: ? Your lipid or cholesterol levels are high. ? You are older than 67 years of age. ? You are at high risk for heart disease.  What should I know about cancer screening? Many types of cancers can be detected early and may often be prevented. Lung Cancer  You should be screened every year for lung cancer if: ? You are a current smoker who has smoked for at least 30 years. ? You are a former smoker who has quit within the past 15 years.  Talk to your health care provider about your screening options, when you should start screening, and how often you should be screened.  Colorectal Cancer  Routine colorectal cancer screening usually begins at 67 years of age and should be repeated every 5-10 years until you are 67 years old. You may need to be screened more often if early forms of precancerous polyps or small growths  are found. Your health care provider may recommend screening at an earlier age if you have risk factors for colon cancer.  Your health care provider may recommend using home test kits to check for hidden blood in the stool.  A small camera at the end of a tube can be used to examine your colon (sigmoidoscopy or colonoscopy). This checks for the earliest forms of colorectal cancer.  Prostate and Testicular Cancer  Depending on your age and overall health, your health care provider may do certain tests to screen for prostate and testicular cancer.  Talk to your health care provider about any symptoms or concerns you have about testicular or prostate cancer.  Skin Cancer  Check your skin from head to toe regularly.  Tell your health care provider about any new moles or changes in moles, especially if: ? There is a change in a mole's size, shape, or color. ? You have a mole that is larger than a pencil eraser.  Always use sunscreen. Apply sunscreen liberally and repeat throughout the day.  Protect yourself by wearing long sleeves, pants, a wide-brimmed hat, and sunglasses when outside.  What should I know about heart disease, diabetes, and high blood pressure?  If you are 56-7 years of age, have your blood pressure checked every 3-5 years. If you are 39 years of age or older, have your blood pressure checked every year. You should have your blood pressure measured twice-once when you  are at a hospital or clinic, and once when you are not at a hospital or clinic. Record the average of the two measurements. To check your blood pressure when you are not at a hospital or clinic, you can use: ? An automated blood pressure machine at a pharmacy. ? A home blood pressure monitor.  Talk to your health care provider about your target blood pressure.  If you are between 75-56 years old, ask your health care provider if you should take aspirin to prevent heart disease.  Have regular diabetes  screenings by checking your fasting blood sugar level. ? If you are at a normal weight and have a low risk for diabetes, have this test once every three years after the age of 31. ? If you are overweight and have a high risk for diabetes, consider being tested at a younger age or more often.  A one-time screening for abdominal aortic aneurysm (AAA) by ultrasound is recommended for men aged 21-75 years who are current or former smokers. What should I know about preventing infection? Hepatitis B If you have a higher risk for hepatitis B, you should be screened for this virus. Talk with your health care provider to find out if you are at risk for hepatitis B infection. Hepatitis C Blood testing is recommended for:  Everyone born from [birth year] through 1966.  Anyone with known risk factors for hepatitis C.  Sexually Transmitted Diseases (STDs)  You should be screened each year for STDs including gonorrhea and chlamydia if: ? You are sexually active and are younger than 67 years of age. ? You are older than 67 years of age and your health care provider tells you that you are at risk for this type of infection. ? Your sexual activity has changed since you were last screened and you are at an increased risk for chlamydia or gonorrhea. Ask your health care provider if you are at risk.  Talk with your health care provider about whether you are at high risk of being infected with HIV. Your health care provider may recommend a prescription medicine to help prevent HIV infection.  What else can I do?  Schedule regular health, dental, and eye exams.  Stay current with your vaccines (immunizations).  Do not use any tobacco products, such as cigarettes, chewing tobacco, and e-cigarettes. If you need help quitting, ask your health care provider.  Limit alcohol intake to no more than 2 drinks per day. One drink equals 12 ounces of beer, 5 ounces of wine, or 1 ounces of hard liquor.  Do not use street  drugs.  Do not share needles.  Ask your health care provider for help if you need support or information about quitting drugs.  Tell your health care provider if you often feel depressed.  Tell your health care provider if you have ever been abused or do not feel safe at home. This information is not intended to replace advice given to you by your health care provider. Make sure you discuss any questions you have with your health care provider. Document Released: 08/01/2007 Document Revised: 10/02/2015 Document Reviewed: 11/06/2014 Elsevier Interactive Patient Education  [employer].

## 2016-10-23 NOTE — Progress Notes (Signed)
Subjective:  Patient ID: Richard [name], male    DOB: [birth year]  Age: 66 y.o. MRN: [id number]  CC: No chief complaint on file.   HPI Richard [name] presents for [facility] well exam C/o rash F/u BPH, HTN  Outpatient Medications Prior to Visit  Medication Sig Dispense Refill  . Calcium Carbonate-Vitamin D (CALCIUM + D PO) Take by mouth daily.    . Cholecalciferol (VITAMIN D) 2000 UNITS CAPS Take by mouth daily.    Marland Kitchen doxazosin (CARDURA) 4 MG tablet TAKE 1 TABLET DAILY 90 tablet 3  . Flaxseed, Linseed, (FLAXSEED OIL PO) Take by mouth. Takes 1400 mg daily    . Multiple Vitamin (MULTIVITAMIN) tablet Take 1 tablet by mouth daily.    . tadalafil (CIALIS) 5 MG tablet Take 1 tablet (5 mg total) by mouth daily. 90 tablet 3  . Thiamine HCl (VITAMIN B-1 PO) Take by mouth daily.    . vitamin C (ASCORBIC ACID) 500 MG tablet Take 500 mg by mouth daily.    Marland Kitchen HYDROcodone-acetaminophen (NORCO/VICODIN) 5-325 MG tablet Take 1-2 tablets by mouth every 6 (six) hours as needed for moderate pain. 30 tablet 0   No facility-administered medications prior to visit.     ROS Review of Systems  Constitutional: Negative for appetite change, fatigue and unexpected weight change.  HENT: Negative for congestion, nosebleeds, sneezing, sore throat and trouble swallowing.   Eyes: Negative for itching and visual disturbance.  Respiratory: Negative for cough.   Cardiovascular: Negative for chest pain, palpitations and leg swelling.  Gastrointestinal: Negative for abdominal distention, blood in stool, diarrhea and nausea.  Genitourinary: Negative for frequency and hematuria.  Musculoskeletal: Negative for back pain, gait problem, joint swelling and neck pain.  Skin: Positive for rash.  Neurological: Negative for dizziness, tremors, speech difficulty and weakness.  Psychiatric/Behavioral: Negative for agitation, dysphoric mood and sleep disturbance. The patient is not nervous/anxious.     Objective:  BP 120/76 (BP  Location: Left Arm, Patient Position: Sitting, Cuff Size: Normal)   Pulse (!) 55   Temp 97.9 F (36.6 C) (Oral)   Ht 6' (1.829 m)   Wt 202 lb (91.6 kg)   SpO2 100%   BMI 27.40 kg/m   BP Readings from Last 3 Encounters:  10/23/16 120/76  02/19/16 128/80  08/06/15 90/68    Wt Readings from Last 3 Encounters:  10/23/16 202 lb (91.6 kg)  02/19/16 209 lb (94.8 kg)  08/06/15 204 lb (92.5 kg)    Physical Exam  Constitutional: He is oriented to person, place, and time. He appears well-developed. No distress.  NAD  HENT:  Mouth/Throat: Oropharynx is clear and moist.  Eyes: Pupils are equal, round, and reactive to light. Conjunctivae are normal.  Neck: Normal range of motion. No JVD present. No thyromegaly present.  Cardiovascular: Normal rate, regular rhythm, normal heart sounds and intact distal pulses.  Exam reveals no gallop and no friction rub.   No murmur heard. Pulmonary/Chest: Effort normal and breath sounds normal. No respiratory distress. He has no wheezes. He has no rales. He exhibits no tenderness.  Abdominal: Soft. Bowel sounds are normal. He exhibits no distension and no mass. There is no tenderness. There is no rebound and no guarding.  Musculoskeletal: Normal range of motion. He exhibits no edema or tenderness.  Lymphadenopathy:    He has no cervical adenopathy.  Neurological: He is alert and oriented to person, place, and time. He has normal reflexes. No cranial nerve deficit. He exhibits normal muscle  tone. He displays a negative Romberg sign. Coordination and gait normal.  Skin: Skin is warm and dry. No rash noted.  Psychiatric: He has a normal mood and affect. His behavior is normal. Judgment and thought content normal.  Prostate 1+ 2 round patches of dry skin on dorsal feet   Lab Results  Component Value Date   WBC 4.4 08/06/2015   HGB 13.5 08/06/2015   HCT 39.2 08/06/2015   PLT 141.0 (L) 08/06/2015   GLUCOSE 102 (H) 08/06/2015   CHOL 166 08/06/2015    TRIG 174.0 (H) 08/06/2015   HDL 36.10 (L) 08/06/2015   LDLDIRECT 80.9 04/16/2011   LDLCALC 95 08/06/2015   ALT 17 08/06/2015   AST 15 08/06/2015   NA 138 08/06/2015   K 4.4 08/06/2015   CL 103 08/06/2015   CREATININE 1.00 08/06/2015   BUN 20 08/06/2015   CO2 30 08/06/2015   TSH 1.52 08/06/2015   PSA 0.74 08/06/2015   HGBA1C 5.3 09/25/2013    No results found.  Assessment & Plan:   There are no diagnoses linked to this encounter. I have discontinued Mr. Carlini's HYDROcodone-acetaminophen. I am also having him maintain his multivitamin, Calcium Carbonate-Vitamin D (CALCIUM + D PO), Vitamin D, Thiamine HCl (VITAMIN B-1 PO), vitamin C, (Flaxseed, Linseed, (FLAXSEED OIL PO)), doxazosin, and tadalafil.  No orders of the defined types were placed in this encounter.    Follow-up: No Follow-up on file.  Walker Kehr, MD

## 2016-10-23 NOTE — Addendum Note (Signed)
Addended by: Karren Cobble on: 10/23/2016 11:48 AM   Modules accepted: Orders

## 2017-02-12 ENCOUNTER — Encounter: Payer: Self-pay | Admitting: Gastroenterology

## 2017-09-20 ENCOUNTER — Telehealth: Payer: Self-pay | Admitting: Internal Medicine

## 2017-09-20 DIAGNOSIS — Z1211 Encounter for screening for malignant neoplasm of colon: Secondary | ICD-10-CM

## 2017-09-20 NOTE — Telephone Encounter (Signed)
Copied from [id number]. Topic: Referral - Request >> Sep 20, 2017  9:16 AM Scherrie Gerlach wrote: Reason for CRM: wife called to advise [facility] called pt and scheduled his colonoscopy, but told her pt needs a referral from his dr. Abbott Pao has appt sept 2019

## 2017-09-23 NOTE — Telephone Encounter (Signed)
Referral placed.

## 2017-10-25 ENCOUNTER — Other Ambulatory Visit (INDEPENDENT_AMBULATORY_CARE_PROVIDER_SITE_OTHER): Payer: Medicare Other

## 2017-10-25 ENCOUNTER — Ambulatory Visit (INDEPENDENT_AMBULATORY_CARE_PROVIDER_SITE_OTHER): Payer: Medicare Other | Admitting: Internal Medicine

## 2017-10-25 ENCOUNTER — Encounter: Payer: Self-pay | Admitting: Internal Medicine

## 2017-10-25 VITALS — BP 118/72 | HR 59 | Temp 97.8°F | Ht 72.0 in | Wt 204.0 lb

## 2017-10-25 DIAGNOSIS — N320 Bladder-neck obstruction: Secondary | ICD-10-CM

## 2017-10-25 DIAGNOSIS — Z23 Encounter for immunization: Secondary | ICD-10-CM

## 2017-10-25 DIAGNOSIS — E785 Hyperlipidemia, unspecified: Secondary | ICD-10-CM

## 2017-10-25 DIAGNOSIS — Z0000 Encounter for general adult medical examination without abnormal findings: Secondary | ICD-10-CM

## 2017-10-25 DIAGNOSIS — M545 Low back pain, unspecified: Secondary | ICD-10-CM | POA: Insufficient documentation

## 2017-10-25 DIAGNOSIS — M722 Plantar fascial fibromatosis: Secondary | ICD-10-CM | POA: Diagnosis not present

## 2017-10-25 DIAGNOSIS — R7309 Other abnormal glucose: Secondary | ICD-10-CM

## 2017-10-25 DIAGNOSIS — G8929 Other chronic pain: Secondary | ICD-10-CM

## 2017-10-25 LAB — LIPID PANEL
CHOLESTEROL: 149 mg/dL (ref 0–200)
HDL: 38.1 mg/dL — ABNORMAL LOW (ref >39.00)
LDL Cholesterol: 84 mg/dL (ref 0–99)
NONHDL: 111.3
Total CHOL/HDL Ratio: 4
Triglycerides: 139 mg/dL (ref 0.0–149.0)
VLDL: 27.8 mg/dL (ref 0.0–40.0)

## 2017-10-25 LAB — URINALYSIS
Bilirubin Urine: NEGATIVE
Hgb urine dipstick: NEGATIVE
Ketones, ur: NEGATIVE
LEUKOCYTES UA: NEGATIVE
NITRITE: NEGATIVE
PH: 5.5 (ref 5.0–8.0)
Total Protein, Urine: NEGATIVE
Urine Glucose: NEGATIVE
Urobilinogen, UA: 0.2 (ref 0.0–1.0)

## 2017-10-25 LAB — HEPATIC FUNCTION PANEL
ALK PHOS: 62 U/L (ref 39–117)
ALT: 14 U/L (ref 0–53)
AST: 13 U/L (ref 0–37)
Albumin: 4.5 g/dL (ref 3.5–5.2)
BILIRUBIN DIRECT: 0.2 mg/dL (ref 0.0–0.3)
TOTAL PROTEIN: 7 g/dL (ref 6.0–8.3)
Total Bilirubin: 1.2 mg/dL (ref 0.2–1.2)

## 2017-10-25 LAB — BASIC METABOLIC PANEL
BUN: 21 mg/dL (ref 6–23)
CO2: 30 mEq/L (ref 19–32)
CREATININE: 1.09 mg/dL (ref 0.40–1.50)
Calcium: 9.4 mg/dL (ref 8.4–10.5)
Chloride: 102 mEq/L (ref 96–112)
GFR: 71.45 mL/min (ref >60.00)
GLUCOSE: 99 mg/dL (ref 70–99)
Potassium: 4.4 mEq/L (ref 3.5–5.1)
Sodium: 138 mEq/L (ref 135–145)

## 2017-10-25 LAB — CBC WITH DIFFERENTIAL/PLATELET
BASOS ABS: 0 10*3/uL (ref 0.0–0.1)
Basophils Relative: 0.4 % (ref 0.0–3.0)
Eosinophils Absolute: 0.1 10*3/uL (ref 0.0–0.7)
Eosinophils Relative: 1.4 % (ref 0.0–5.0)
HEMATOCRIT: 38.1 % — AB (ref 39.0–52.0)
Hemoglobin: 13.5 g/dL (ref 13.0–17.0)
LYMPHS PCT: 27.3 % (ref 12.0–46.0)
Lymphs Abs: 1.1 10*3/uL (ref 0.7–4.0)
MCHC: 35.6 g/dL (ref 30.0–36.0)
MCV: 91.5 fl (ref 78.0–100.0)
MONOS PCT: 12.1 % — AB (ref 3.0–12.0)
Monocytes Absolute: 0.5 10*3/uL (ref 0.1–1.0)
Neutro Abs: 2.4 10*3/uL (ref 1.4–7.7)
Neutrophils Relative %: 58.8 % (ref 43.0–77.0)
Platelets: 128 10*3/uL — ABNORMAL LOW (ref 150.0–400.0)
RBC: 4.16 Mil/uL — AB (ref 4.22–5.81)
RDW: 13.1 % (ref 11.5–15.5)
WBC: 4.2 10*3/uL (ref 4.0–10.5)

## 2017-10-25 LAB — TSH: TSH: 1.67 u[IU]/mL (ref 0.35–4.50)

## 2017-10-25 LAB — PSA: PSA: 0.88 ng/mL (ref 0.10–4.00)

## 2017-10-25 MED ORDER — DOXAZOSIN MESYLATE 4 MG PO TABS: 4.0000 mg | ORAL_TABLET | Freq: Every day | ORAL | 3 refills | Status: DC

## 2017-10-25 MED ORDER — DOXAZOSIN MESYLATE 4 MG PO TABS
4.0000 mg | ORAL_TABLET | Freq: Every day | ORAL | 3 refills | Status: DC
Start: 2017-10-25 — End: 2017-10-25

## 2017-10-25 NOTE — Progress Notes (Signed)
Subjective:  Patient ID: Richard [name], male    DOB: 04-17-1950  Age: 67 y.o. MRN: [id number]  CC: No chief complaint on file.   HPI Richard [name] presents for [facility] well exam F/u BPH C/o occ LBP  Outpatient Medications Prior to Visit  Medication Sig Dispense Refill  . Calcium Carbonate-Vitamin D (CALCIUM + D PO) Take by mouth daily.    . Cholecalciferol (VITAMIN D) 2000 UNITS CAPS Take by mouth daily.    . Flaxseed, Linseed, (FLAXSEED OIL PO) Take by mouth. Takes 1400 mg daily    . Multiple Vitamin (MULTIVITAMIN) tablet Take 1 tablet by mouth daily.    . Thiamine HCl (VITAMIN B-1 PO) Take by mouth daily.    . vitamin C (ASCORBIC ACID) 500 MG tablet Take 500 mg by mouth daily.    Marland Kitchen doxazosin (CARDURA) 4 MG tablet Take 1 tablet (4 mg total) by mouth daily. 90 tablet 3  . tadalafil (CIALIS) 5 MG tablet Take 1 tablet (5 mg total) by mouth daily. 90 tablet 3   No facility-administered medications prior to visit.     ROS: Review of Systems  Constitutional: Negative for appetite change, fatigue and unexpected weight change.  HENT: Negative for congestion, nosebleeds, sneezing, sore throat and trouble swallowing.   Eyes: Negative for itching and visual disturbance.  Respiratory: Negative for cough.   Cardiovascular: Negative for chest pain, palpitations and leg swelling.  Gastrointestinal: Negative for abdominal distention, blood in stool, diarrhea and nausea.  Genitourinary: Positive for frequency. Negative for hematuria.  Musculoskeletal: Positive for back pain. Negative for gait problem, joint swelling and neck pain.  Skin: Negative for rash.  Neurological: Negative for dizziness, tremors, speech difficulty and weakness.  Psychiatric/Behavioral: Negative for agitation, dysphoric mood and sleep disturbance. The patient is not nervous/anxious.     Objective:  BP 118/72 (BP Location: Left Arm, Patient Position: Sitting, Cuff Size: Large)   Pulse (!) 59   Temp 97.8 F (36.6  C) (Oral)   Ht 6' (1.829 m)   Wt 204 lb (92.5 kg)   SpO2 97%   BMI 27.67 kg/m   BP Readings from Last 3 Encounters:  10/25/17 118/72  10/23/16 120/76  02/19/16 128/80    Wt Readings from Last 3 Encounters:  10/25/17 204 lb (92.5 kg)  10/23/16 202 lb (91.6 kg)  02/19/16 209 lb (94.8 kg)    Physical Exam  Constitutional: He is oriented to person, place, and time. He appears well-developed and well-nourished. No distress.  HENT:  Head: Normocephalic and atraumatic.  Right Ear: External ear normal.  Left Ear: External ear normal.  Nose: Nose normal.  Mouth/Throat: Oropharynx is clear and moist. No oropharyngeal exudate.  Eyes: Pupils are equal, round, and reactive to light. Conjunctivae and EOM are normal. Right eye exhibits no discharge. Left eye exhibits no discharge. No scleral icterus.  Neck: Normal range of motion. Neck supple. No JVD present. No tracheal deviation present. No thyromegaly present.  Cardiovascular: Normal rate, regular rhythm, normal heart sounds and intact distal pulses. Exam reveals no gallop and no friction rub.  No murmur heard. Pulmonary/Chest: Effort normal and breath sounds normal. No stridor. No respiratory distress. He has no wheezes. He has no rales. He exhibits no tenderness.  Abdominal: Soft. Bowel sounds are normal. He exhibits no distension and no mass. There is no tenderness. There is no rebound and no guarding.  Musculoskeletal: Normal range of motion. He exhibits no edema or tenderness.  Lymphadenopathy:  He has no cervical adenopathy.  Neurological: He is alert and oriented to person, place, and time. He has normal reflexes. He displays normal reflexes. No cranial nerve deficit. He exhibits normal muscle tone. Coordination normal.  Skin: Skin is warm and dry. No rash noted. He is not diaphoretic. No erythema. No pallor.  Psychiatric: He has a normal mood and affect. His behavior is normal. Judgment and thought content normal.  Rectal exam  per GI pending  Lab Results  Component Value Date   WBC 4.2 10/23/2016   HGB 13.8 10/23/2016   HCT 39.2 10/23/2016   PLT 126.0 (L) 10/23/2016   GLUCOSE 99 10/23/2016   CHOL 163 10/23/2016   TRIG 182.0 (H) 10/23/2016   HDL 36.90 (L) 10/23/2016   LDLDIRECT 80.9 04/16/2011   LDLCALC 89 10/23/2016   ALT 14 10/23/2016   AST 15 10/23/2016   NA 140 10/23/2016   K 4.1 10/23/2016   CL 103 10/23/2016   CREATININE 0.98 10/23/2016   BUN 21 10/23/2016   CO2 31 10/23/2016   TSH 1.74 10/23/2016   PSA 0.86 10/23/2016   HGBA1C 5.3 09/25/2013    No results found.  Assessment & Plan:   Diagnoses and all orders for this visit:  Need for influenza vaccination -     Flu vaccine HIGH DOSE PF (Fluzone High dose)  Other orders -     doxazosin (CARDURA) 4 MG tablet; Take 1 tablet (4 mg total) by mouth daily.     Meds ordered this encounter  Medications  . doxazosin (CARDURA) 4 MG tablet    Sig: Take 1 tablet (4 mg total) by mouth daily.    Dispense:  90 tablet    Refill:  3     Follow-up: No follow-ups on file.  Walker Kehr, MD

## 2017-10-25 NOTE — Assessment & Plan Note (Signed)
Here for medicare wellness/physical  Diet: heart healthy  Physical activity: not sedentary  Depression/mood screen: negative  Hearing: intact to whispered voice  Visual acuity: grossly normal, performs annual eye exam  ADLs: capable  Fall risk: low to none  Home safety: good  Cognitive evaluation: intact to orientation, naming, recall and repetition  EOL planning: adv directives, full code/ I agree  I have personally reviewed and have noted  1. The patient's medical, surgical and social history  2. Their use of alcohol, tobacco or illicit drugs  3. Their current medications and supplements  4. The patient's functional ability including ADL's, fall risks, home safety risks and hearing or visual impairment.  5. Diet and physical activities  6. Evidence for depression or mood disorders 7. The roster of all physicians providing medical care to patient - is listed in the Snapshot section of the chart and reviewed today.    Today patient counseled on age appropriate routine health concerns for screening and prevention, each reviewed and up to date or declined. Immunizations reviewed and up to date or declined. Labs ordered and reviewed. Risk factors for depression reviewed and negative. Hearing function and visual acuity are intact. ADLs screened and addressed as needed. Functional ability and level of safety reviewed and appropriate. Education, counseling and referrals performed based on assessed risks today. Patient provided with a copy of personalized plan for preventive services.   Colon due 2019

## 2017-10-25 NOTE — Assessment & Plan Note (Signed)
Glucose

## 2017-10-25 NOTE — Assessment & Plan Note (Signed)
Stretch the back

## 2017-10-25 NOTE — Assessment & Plan Note (Signed)
Will ref to Dr Raeford Razor

## 2017-10-27 ENCOUNTER — Encounter: Payer: Self-pay | Admitting: Family Medicine

## 2017-10-27 ENCOUNTER — Ambulatory Visit (INDEPENDENT_AMBULATORY_CARE_PROVIDER_SITE_OTHER): Payer: Medicare Other | Admitting: Family Medicine

## 2017-10-27 VITALS — BP 132/74 | HR 88 | Ht 72.0 in | Wt 208.0 lb

## 2017-10-27 DIAGNOSIS — R252 Cramp and spasm: Secondary | ICD-10-CM

## 2017-10-27 DIAGNOSIS — M79672 Pain in left foot: Secondary | ICD-10-CM | POA: Diagnosis not present

## 2017-10-27 DIAGNOSIS — M79671 Pain in right foot: Secondary | ICD-10-CM | POA: Diagnosis not present

## 2017-10-27 LAB — IRON,TIBC AND FERRITIN PANEL
%SAT: 30 % (calc) (ref 20–48)
FERRITIN: 184 ng/mL (ref 24–380)
IRON: 83 ug/dL (ref 50–180)
TIBC: 280 mcg/dL (calc) (ref 250–425)

## 2017-10-27 NOTE — Patient Instructions (Signed)
Nice to meet you  Please try the exercises  Please try the midfoot strap  Please set up an appointment later to have the orthotics made

## 2017-10-27 NOTE — Progress Notes (Signed)
Richard [name] - 67 y.o. male MRN [id number]  Date of birth: 02/25/1950  SUBJECTIVE:  Including CC & ROS.  Chief Complaint  Patient presents with  . Foot Pain    Richard [name] is a 67 y.o. male that is presenting with foot pain. Pain is chronic. Pain is located bilaterally on the plantar aspect. He has been wearing orthotics with some improvement. Pain is mild during dorsiflexion and standing for long periods. He has not taken anything for the pain.  The pain is occurring in his arch as well as in the plantar forefoot underneath the metatarsal heads.  He denies any injury.  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  He mainly bikes.  He denies any injury or inciting event.  The pain is worse with prolonged standing.  He does not usually take medications.    Review of Systems  Constitutional: Negative for fever.  HENT: Negative for congestion.   Respiratory: Negative for cough.   Cardiovascular: Negative for chest pain.  Gastrointestinal: Negative for abdominal pain.  Musculoskeletal: Negative for back pain.  Skin: Negative for color change.  Neurological: Negative for weakness.  Hematological: Negative for adenopathy.  Psychiatric/Behavioral: Negative for agitation.    HISTORY: Past Medical, Surgical, Social, and Family History Reviewed & Updated per EMR.   Pertinent Historical Findings include:  Past Medical History:  Diagnosis Date  . BPH (benign prostatic hyperplasia)   . Hypogonadism male   . Mononucleosis   . Mumps   . Plantar fasciitis   . PONV (postoperative nausea and vomiting)   . Zoster     Past Surgical History:  Procedure Laterality Date  . circumsicion  2007   asa adult for balanitis w/ phimosis  . HERNIA REPAIR Left   . INGUINAL HERNIA REPAIR  childhood   left  . LIPOMA EXCISION N/A 02/19/2016   Procedure: EXCISION SUBCUTANEOUS LIPOMA BACK;  Surgeon: Donnie Mesa, MD;  Location: [place];  Service: General;   Laterality: N/A;  . MOUTH SURGERY     early eruption of incisors requiring surgical extraction  . TONSILLECTOMY  1959  . WISDOM TOOTH EXTRACTION  1990    Allergies  Allergen Reactions  . Aspirin     REACTION: causes rash    Family History  Problem Relation Age of Onset  . Arthritis Mother        spinal stenosis s/p surgery  . Diabetes Neg Hx   . Heart disease Neg Hx   . Colon cancer Neg Hx   . Stomach cancer Neg Hx      Social History   Socioeconomic History  . Marital status: Married    Spouse name: Not on file  . Number of children: 3  . Years of education: 29  . Highest education level: Not on file  Occupational History  . Occupation: Geographical information systems officer  . Financial resource strain: Not on file  . Food insecurity:    Worry: Not on file    Inability: Not on file  . Transportation needs:    Medical: Not on file    Non-medical: Not on file  Tobacco Use  . Smoking status: Former Smoker    Last attempt to quit: 04/16/1967    Years since quitting: 50.5  . Smokeless tobacco: Never Used  Substance and Sexual Activity  . Alcohol use: No  . Drug use: No  . Sexual activity: Yes    Partners: Female  Lifestyle  .  Physical activity:    Days per week: Not on file    Minutes per session: Not on file  . Stress: Not on file  Relationships  . Social connections:    Talks on phone: Not on file    Gets together: Not on file    Attends religious service: Not on file    Active member of club or organization: Not on file    Attends meetings of clubs or organizations: Not on file    Relationship status: Not on file  . Intimate partner violence:    Fear of current or ex partner: Not on file    Emotionally abused: Not on file    Physically abused: Not on file    Forced sexual activity: Not on file  Other Topics Concern  . Not on file  Social History Narrative   HSG, [place]. Married '75. 2 sons - '71(adopted), '76; 1 dtr (adopted) '73; 4 grandchildren.    Work - Actuary, currently Garment/textile technologist. Marriage is in good health. ACP - HCPOA - wife and son; yes - CPR; yes for acute care; no prolonged futile or heroic measures.      JEHOVAH'S WITNESS - OK for cell salvage, volume expanded, immunoglobulins. Solid organ transplant - ????     PHYSICAL EXAM:  VS: BP 132/74   Pulse 88   Ht 6' (1.829 m)   Wt 208 lb (94.3 kg)   SpO2 98%   BMI 28.21 kg/m  Physical Exam Gen: NAD, alert, cooperative with exam, well-appearing ENT: normal lips, normal nasal mucosa,  Eye: normal EOM, normal conjunctiva and lids CV:  no edema, +2 pedal pulses   Resp: no accessory muscle use, non-labored,  Skin: no rashes, no areas of induration  Neuro: normal tone, normal sensation to touch Psych:  normal insight, alert and oriented MSK:  Left and right foot:  TTP along the arch midfoot  Mild TTP of the calcaneous  TTP along MT heads  Normal ROM  Normal strength to resistance  Some loss of the transverse arch  4th MT on right is flared   Neurovascularly intact      ASSESSMENT & PLAN:   Pain in both feet Symptoms seem to be more related to metatarsalgia.  May have a component of plantar fasciitis as well.  Has some in balance with single leg standing. -Counseled on home exercise therapy. -Provided samples of Pennsaid. -Counseled on a midfoot arch strap. -Can follow-up to have custom orthotics made.  Leg cramping Has lower leg cramping and pain intermittently.  Could be related to iron or fatigue as he does have imbalance. -Counseled on home exercise therapy. -Iron and ferritin.

## 2017-10-28 ENCOUNTER — Telehealth: Payer: Self-pay | Admitting: Family Medicine

## 2017-10-28 DIAGNOSIS — M7742 Metatarsalgia, left foot: Secondary | ICD-10-CM

## 2017-10-28 DIAGNOSIS — M7741 Metatarsalgia, right foot: Secondary | ICD-10-CM | POA: Insufficient documentation

## 2017-10-28 DIAGNOSIS — R252 Cramp and spasm: Secondary | ICD-10-CM | POA: Insufficient documentation

## 2017-10-28 NOTE — Assessment & Plan Note (Signed)
Symptoms seem to be more related to metatarsalgia.  May have a component of plantar fasciitis as well.  Has some in balance with single leg standing. -Counseled on home exercise therapy. -Provided samples of Pennsaid. -Counseled on a midfoot arch strap. -Can follow-up to have custom orthotics made.

## 2017-10-28 NOTE — Assessment & Plan Note (Signed)
Has lower leg cramping and pain intermittently.  Could be related to iron or fatigue as he does have imbalance. -Counseled on home exercise therapy. -Iron and ferritin.

## 2017-10-28 NOTE — Telephone Encounter (Signed)
Spoke with patient about results.   Rosemarie Ax, MD [facility] 10/28/2017, 9:08 AM

## 2017-10-29 ENCOUNTER — Ambulatory Visit: Payer: Medicare Other | Admitting: Family Medicine

## 2017-11-02 ENCOUNTER — Ambulatory Visit (INDEPENDENT_AMBULATORY_CARE_PROVIDER_SITE_OTHER): Payer: Medicare Other | Admitting: Family Medicine

## 2017-11-02 DIAGNOSIS — M7742 Metatarsalgia, left foot: Secondary | ICD-10-CM

## 2017-11-02 DIAGNOSIS — M7741 Metatarsalgia, right foot: Secondary | ICD-10-CM

## 2017-11-02 NOTE — Progress Notes (Signed)
Richard [name] - 67 y.o. male MRN [id number]  Date of birth: 07/14/1950  SUBJECTIVE:  Including CC & ROS.  Chief Complaint  Patient presents with  . Follow-up    B foot pain    Richard [name] is a 67 y.o. male that is presenting for f/u of B foot pain.  He was last seen on 10/27/17 by Dr. Raeford Razor and is here today to get custom orthotics (size 12) made.  He states that the exercises and the stretches have been helping but is interested in getting another pair of orthotics made as his current ones are approximately 68 years old.    Review of Systems  HISTORY: Past Medical, Surgical, Social, and Family History Reviewed & Updated per EMR.   Pertinent Historical Findings include:  Past Medical History:  Diagnosis Date  . BPH (benign prostatic hyperplasia)   . Hypogonadism male   . Mononucleosis   . Mumps   . Plantar fasciitis   . PONV (postoperative nausea and vomiting)   . Zoster     Past Surgical History:  Procedure Laterality Date  . circumsicion  2007   asa adult for balanitis w/ phimosis  . HERNIA REPAIR Left   . INGUINAL HERNIA REPAIR  childhood   left  . LIPOMA EXCISION N/A 02/19/2016   Procedure: EXCISION SUBCUTANEOUS LIPOMA BACK;  Surgeon: Donnie Mesa, MD;  Location: [place];  Service: General;  Laterality: N/A;  . MOUTH SURGERY     early eruption of incisors requiring surgical extraction  . TONSILLECTOMY  1959  . WISDOM TOOTH EXTRACTION  1990    Allergies  Allergen Reactions  . Aspirin     REACTION: causes rash    Family History  Problem Relation Age of Onset  . Arthritis Mother        spinal stenosis s/p surgery  . Diabetes Neg Hx   . Heart disease Neg Hx   . Colon cancer Neg Hx   . Stomach cancer Neg Hx      Social History   Socioeconomic History  . Marital status: Married    Spouse name: Not on file  . Number of children: 3  . Years of education: 34  . Highest education level: Not on file  Occupational History  .  Occupation: Geographical information systems officer  . Financial resource strain: Not on file  . Food insecurity:    Worry: Not on file    Inability: Not on file  . Transportation needs:    Medical: Not on file    Non-medical: Not on file  Tobacco Use  . Smoking status: Former Smoker    Last attempt to quit: 04/16/1967    Years since quitting: 50.5  . Smokeless tobacco: Never Used  Substance and Sexual Activity  . Alcohol use: No  . Drug use: No  . Sexual activity: Yes    Partners: Female  Lifestyle  . Physical activity:    Days per week: Not on file    Minutes per session: Not on file  . Stress: Not on file  Relationships  . Social connections:    Talks on phone: Not on file    Gets together: Not on file    Attends religious service: Not on file    Active member of club or organization: Not on file    Attends meetings of clubs or organizations: Not on file    Relationship status: Not on file  . Intimate partner violence:  Fear of current or ex partner: Not on file    Emotionally abused: Not on file    Physically abused: Not on file    Forced sexual activity: Not on file  Other Topics Concern  . Not on file  Social History Narrative   HSG, [place]. Married '75. 2 sons - '71(adopted), '76; 1 dtr (adopted) '73; 4 grandchildren.   Work - Actuary, currently Garment/textile technologist. Marriage is in good health. ACP - HCPOA - wife and son; yes - CPR; yes for acute care; no prolonged futile or heroic measures.      JEHOVAH'S WITNESS - OK for cell salvage, volume expanded, immunoglobulins. Solid organ transplant - ????     PHYSICAL EXAM:  VS: There were no vitals taken for this visit. Physical Exam Gen: NAD, alert, cooperative with exam, well-appearing      ASSESSMENT & PLAN:   Metatarsalgia of both feet May need to add extra metatarsal cookie b/l   Patient was fitted for a standard, cushioned, semi-rigid orthotic. The orthotic was heated and afterward the patient stood on  the orthotic blank positioned on the orthotic stand. The patient was positioned in subtalar neutral position and 10 degrees of ankle dorsiflexion in a weight bearing stance. After completion of molding, a stable base was applied to the orthotic blank. The blank was ground to a stable position for weight bearing. Size: 12 Base: Blue EVA Additional Posting and Padding: None The patient ambulated these, and they were very comfortable.

## 2017-11-02 NOTE — Assessment & Plan Note (Signed)
May need to add extra metatarsal cookie b/l   Patient was fitted for a standard, cushioned, semi-rigid orthotic. The orthotic was heated and afterward the patient stood on the orthotic blank positioned on the orthotic stand. The patient was positioned in subtalar neutral position and 10 degrees of ankle dorsiflexion in a weight bearing stance. After completion of molding, a stable base was applied to the orthotic blank. The blank was ground to a stable position for weight bearing. Size: 12 Base: Blue EVA Additional Posting and Padding: None The patient ambulated these, and they were very comfortable.

## 2017-11-03 ENCOUNTER — Ambulatory Visit (AMBULATORY_SURGERY_CENTER): Payer: Self-pay | Admitting: *Deleted

## 2017-11-03 VITALS — Ht 72.0 in | Wt 209.0 lb

## 2017-11-03 DIAGNOSIS — Z86010 Personal history of colonic polyps: Secondary | ICD-10-CM

## 2017-11-03 MED ORDER — PEG 3350-KCL-NA BICARB-NACL 420 G PO SOLR: 4000.0000 mL | Freq: Once | ORAL | 0 refills | Status: AC

## 2017-11-03 NOTE — Progress Notes (Signed)
Wife with pt during pv. Patient denies any allergies to eggs or soy. Patient denies any problems with anesthesia/sedation. Post op n&v. Patient denies any oxygen use at home. Patient denies taking any diet/weight loss medications or blood thinners. EMMI education offered, pt declined.

## 2017-11-17 ENCOUNTER — Ambulatory Visit (AMBULATORY_SURGERY_CENTER): Payer: Medicare Other | Admitting: Gastroenterology

## 2017-11-17 ENCOUNTER — Encounter: Payer: Self-pay | Admitting: Gastroenterology

## 2017-11-17 VITALS — BP 127/73 | HR 50 | Temp 97.5°F | Resp 12 | Ht 72.0 in | Wt 209.0 lb

## 2017-11-17 DIAGNOSIS — Z1211 Encounter for screening for malignant neoplasm of colon: Secondary | ICD-10-CM | POA: Diagnosis not present

## 2017-11-17 DIAGNOSIS — Z86010 Personal history of colonic polyps: Secondary | ICD-10-CM

## 2017-11-17 MED ORDER — SODIUM CHLORIDE 0.9 % IV SOLN: 500.0000 mL | Freq: Once | INTRAVENOUS | Status: DC

## 2017-11-17 NOTE — Patient Instructions (Signed)
YOU HAD AN ENDOSCOPIC PROCEDURE TODAY AT [facility]:   Refer to the procedure report that was given to you for any specific questions about what was found during the examination.  If the procedure report does not answer your questions, please call your gastroenterologist to clarify.  If you requested that your care partner not be given the details of your procedure findings, then the procedure report has been included in a sealed envelope for you to review at your convenience later.  YOU SHOULD EXPECT: Some feelings of bloating in the abdomen. Passage of more gas than usual.  Walking can help get rid of the air that was put into your GI tract during the procedure and reduce the bloating. If you had a lower endoscopy (such as a colonoscopy or flexible sigmoidoscopy) you may notice spotting of blood in your stool or on the toilet paper. If you underwent a bowel prep for your procedure, you may not have a normal bowel movement for a few days.  Please Note:  You might notice some irritation and congestion in your nose or some drainage.  This is from the oxygen used during your procedure.  There is no need for concern and it should clear up in a day or so.  SYMPTOMS TO REPORT IMMEDIATELY:   Following lower endoscopy (colonoscopy or flexible sigmoidoscopy):  Excessive amounts of blood in the stool  Significant tenderness or worsening of abdominal pains  Swelling of the abdomen that is new, acute  Fever of 100F or higher  For urgent or emergent issues, a gastroenterologist can be reached at any hour by calling [phone].   DIET:  We do recommend a small meal at first, but then you may proceed to your regular diet.  Follow a High Fiber Diet. Drink plenty of fluids but you should avoid alcoholic beverages for 24 hours.  MEDICATIONS: Continue present medications.  Please see handouts given to you by your recovery nurse.  ACTIVITY:  You should plan to take it easy for the rest of  today and you should NOT DRIVE or use heavy machinery until tomorrow (because of the sedation medicines used during the test).    FOLLOW UP: Our staff will call the number listed on your records the next business day following your procedure to check on you and address any questions or concerns that you may have regarding the information given to you following your procedure. If we do not reach you, we will leave a message.  However, if you are feeling well and you are not experiencing any problems, there is no need to return our call.  We will assume that you have returned to your regular daily activities without incident.  If any biopsies were taken you will be contacted by phone or by letter within the next 1-3 weeks.  Please call us at [phone] if you have not heard about the biopsies in 3 weeks.   Thank you for allowing [place] to provide for your healthcare needs today.  SIGNATURES/CONFIDENTIALITY: You and/or your care partner have signed paperwork which will be entered into your electronic medical record.  These signatures attest to the fact that that the information above on your After Visit Summary has been reviewed and is understood.  Full responsibility of the confidentiality of this discharge information lies with you and/or your care-partner.

## 2017-11-17 NOTE — Op Note (Signed)
[place] Patient Name: Richard [name] Procedure Date: 11/17/2017 11:00 AM MRN: [id number] Endoscopist: Ladene Artist , MD Age: 67 Referring MD:  Date of Birth: Feb 08, 1951 Gender: Male Account #: [id number] Procedure:                Colonoscopy Indications:              Surveillance: Personal history of adenomatous                            polyps on last colonoscopy 5 years ago Medicines:                Monitored Anesthesia Care Procedure:                Pre-Anesthesia Assessment:                           - Prior to the procedure, a History and Physical                            was performed, and patient medications and                            allergies were reviewed. The patient's tolerance of                            previous anesthesia was also reviewed. The risks                            and benefits of the procedure and the sedation                            options and risks were discussed with the patient.                            All questions were answered, and informed consent                            was obtained. Prior Anticoagulants: The patient has                            taken no previous anticoagulant or antiplatelet                            agents. ASA Grade Assessment: II - A patient with                            mild systemic disease. After reviewing the risks                            and benefits, the patient was deemed in                            satisfactory condition to undergo the procedure.  After obtaining informed consent, the colonoscope                            was passed under direct vision. Throughout the                            procedure, the patient's blood pressure, pulse, and                            oxygen saturations were monitored continuously. The                            Colonoscope was introduced through the anus and                            advanced to the the  cecum, identified by                            appendiceal orifice and ileocecal valve. The                            ileocecal valve, appendiceal orifice, and rectum                            were photographed. The quality of the bowel                            preparation was adequate after extensive lavage and                            suctioning. The colonoscopy was performed without                            difficulty. The patient tolerated the procedure                            well. Scope In: 11:07:40 AM Scope Out: 11:22:25 AM Scope Withdrawal Time: 0 hours 10 minutes 34 seconds  Total Procedure Duration: 0 hours 14 minutes 45 seconds  Findings:                 The perianal and digital rectal examinations were                            normal.                           Multiple medium-mouthed diverticula were found in                            the sigmoid colon, descending colon and transverse                            colon.  Internal hemorrhoids were found during                            retroflexion. The hemorrhoids were medium-sized and                            Grade I (internal hemorrhoids that do not prolapse).                           The exam was otherwise without abnormality on                            direct and retroflexion views. Complications:            No immediate complications. Estimated blood loss:                            None. Estimated Blood Loss:     Estimated blood loss: none. Impression:               - Diverticulosis in the sigmoid colon, in the                            descending colon and in the transverse colon.                           - Internal hemorrhoids.                           - The examination was otherwise normal on direct                            and retroflexion views.                           - No specimens collected. Recommendation:           - Repeat colonoscopy in 5 years for  surveillance                            with a more extensive bowel prep.                           - Patient has a contact number available for                            emergencies. The signs and symptoms of potential                            delayed complications were discussed with the                            patient. Return to normal activities tomorrow.                            Written discharge instructions were provided to the  patient.                           - High fiber diet.                           - Continue present medications. Ladene Artist, MD 11/17/2017 11:35:32 AM This report has been signed electronically.

## 2017-11-17 NOTE — Progress Notes (Signed)
A and O x3. Report to RN. Tolerated MAC anesthesia well.

## 2017-11-18 ENCOUNTER — Telehealth: Payer: Self-pay | Admitting: *Deleted

## 2017-11-18 NOTE — Telephone Encounter (Signed)
  Follow up Call-  Call back number 11/17/2017  Post procedure Call Back phone  # [phone]  Permission to leave phone message Yes  Some recent data might be hidden     Patient questions:  Do you have a fever, pain , or abdominal swelling? No. Pain Score  0 *  Have you tolerated food without any problems? Yes.    Have you been able to return to your normal activities? Yes.    Do you have any questions about your discharge instructions: Diet   No. Medications  No. Follow up visit  No.  Do you have questions or concerns about your Care? No.  Actions: * If pain score is 4 or above: No action needed, pain <4.

## 2018-06-06 ENCOUNTER — Other Ambulatory Visit: Payer: Self-pay | Admitting: Internal Medicine

## 2018-10-21 ENCOUNTER — Other Ambulatory Visit: Payer: Self-pay

## 2018-10-21 ENCOUNTER — Ambulatory Visit (INDEPENDENT_AMBULATORY_CARE_PROVIDER_SITE_OTHER): Payer: Medicare Other

## 2018-10-21 DIAGNOSIS — Z23 Encounter for immunization: Secondary | ICD-10-CM

## 2018-10-27 ENCOUNTER — Encounter: Payer: Self-pay | Admitting: Internal Medicine

## 2018-10-27 ENCOUNTER — Ambulatory Visit (INDEPENDENT_AMBULATORY_CARE_PROVIDER_SITE_OTHER): Payer: Medicare Other | Admitting: Internal Medicine

## 2018-10-27 ENCOUNTER — Other Ambulatory Visit: Payer: Self-pay

## 2018-10-27 ENCOUNTER — Other Ambulatory Visit (INDEPENDENT_AMBULATORY_CARE_PROVIDER_SITE_OTHER): Payer: Medicare Other

## 2018-10-27 VITALS — BP 122/68 | HR 87 | Temp 97.6°F | Ht 72.0 in | Wt 188.0 lb

## 2018-10-27 DIAGNOSIS — E785 Hyperlipidemia, unspecified: Secondary | ICD-10-CM | POA: Diagnosis not present

## 2018-10-27 DIAGNOSIS — N400 Enlarged prostate without lower urinary tract symptoms: Secondary | ICD-10-CM

## 2018-10-27 DIAGNOSIS — E291 Testicular hypofunction: Secondary | ICD-10-CM | POA: Diagnosis not present

## 2018-10-27 DIAGNOSIS — L570 Actinic keratosis: Secondary | ICD-10-CM | POA: Diagnosis not present

## 2018-10-27 DIAGNOSIS — R7309 Other abnormal glucose: Secondary | ICD-10-CM | POA: Diagnosis not present

## 2018-10-27 LAB — BASIC METABOLIC PANEL
BUN: 25 mg/dL — ABNORMAL HIGH (ref 6–23)
CO2: 27 mEq/L (ref 19–32)
Calcium: 9.1 mg/dL (ref 8.4–10.5)
Chloride: 104 mEq/L (ref 96–112)
Creatinine, Ser: 1.14 mg/dL (ref 0.40–1.50)
GFR: 63.65 mL/min (ref >60.00)
Glucose, Bld: 97 mg/dL (ref 70–99)
Potassium: 4.3 mEq/L (ref 3.5–5.1)
Sodium: 138 mEq/L (ref 135–145)

## 2018-10-27 LAB — HEPATIC FUNCTION PANEL
ALT: 17 U/L (ref 0–53)
AST: 16 U/L (ref 0–37)
Albumin: 4.3 g/dL (ref 3.5–5.2)
Alkaline Phosphatase: 58 U/L (ref 39–117)
Bilirubin, Direct: 0.1 mg/dL (ref 0.0–0.3)
Total Bilirubin: 0.9 mg/dL (ref 0.2–1.2)
Total Protein: 6.8 g/dL (ref 6.0–8.3)

## 2018-10-27 LAB — TSH: TSH: 2.4 u[IU]/mL (ref 0.35–4.50)

## 2018-10-27 LAB — CBC WITH DIFFERENTIAL/PLATELET
Basophils Absolute: 0 10*3/uL (ref 0.0–0.1)
Basophils Relative: 0.5 % (ref 0.0–3.0)
Eosinophils Absolute: 0.1 10*3/uL (ref 0.0–0.7)
Eosinophils Relative: 2.4 % (ref 0.0–5.0)
HCT: 37.1 % — ABNORMAL LOW (ref 39.0–52.0)
Hemoglobin: 13 g/dL (ref 13.0–17.0)
Lymphocytes Relative: 36.9 % (ref 12.0–46.0)
Lymphs Abs: 1.3 10*3/uL (ref 0.7–4.0)
MCHC: 35.2 g/dL (ref 30.0–36.0)
MCV: 93.3 fl (ref 78.0–100.0)
Monocytes Absolute: 0.4 10*3/uL (ref 0.1–1.0)
Monocytes Relative: 11.2 % (ref 3.0–12.0)
Neutro Abs: 1.7 10*3/uL (ref 1.4–7.7)
Neutrophils Relative %: 49 % (ref 43.0–77.0)
Platelets: 112 10*3/uL — ABNORMAL LOW (ref 150.0–400.0)
RBC: 3.97 Mil/uL — ABNORMAL LOW (ref 4.22–5.81)
RDW: 13.3 % (ref 11.5–15.5)
WBC: 3.5 10*3/uL — ABNORMAL LOW (ref 4.0–10.5)

## 2018-10-27 LAB — URINALYSIS
Bilirubin Urine: NEGATIVE
Hgb urine dipstick: NEGATIVE
Ketones, ur: NEGATIVE
Leukocytes,Ua: NEGATIVE
Nitrite: NEGATIVE
Specific Gravity, Urine: 1.02 (ref 1.000–1.030)
Total Protein, Urine: NEGATIVE
Urine Glucose: NEGATIVE
Urobilinogen, UA: 0.2 (ref 0.0–1.0)
pH: 6 (ref 5.0–8.0)

## 2018-10-27 LAB — LIPID PANEL
Cholesterol: 149 mg/dL (ref 0–200)
HDL: 40.3 mg/dL (ref >39.00)
LDL Cholesterol: 84 mg/dL (ref 0–99)
NonHDL: 108.25
Total CHOL/HDL Ratio: 4
Triglycerides: 123 mg/dL (ref 0.0–149.0)
VLDL: 24.6 mg/dL (ref 0.0–40.0)

## 2018-10-27 LAB — PSA: PSA: 0.94 ng/mL (ref 0.10–4.00)

## 2018-10-27 LAB — HEMOGLOBIN A1C: Hgb A1c MFr Bld: 5.1 % (ref 4.6–6.5)

## 2018-10-27 NOTE — Assessment & Plan Note (Signed)
A1c Pt lost wt on diet

## 2018-10-27 NOTE — Assessment & Plan Note (Signed)
PSA

## 2018-10-27 NOTE — Progress Notes (Signed)
Subjective:  Patient ID: Richard [name], male    DOB: 01-21-1951  Age: 68 y.o. MRN: [id number]  CC: No chief complaint on file.   HPI JABBAR KULIGOWSKI presents for skin spots - chronic Pt lost wt on diet F/u elev glucose, BPH  Outpatient Medications Prior to Visit  Medication Sig Dispense Refill  . Cholecalciferol (VITAMIN D) 2000 UNITS CAPS Take by mouth daily.    Marland Kitchen doxazosin (CARDURA) 4 MG tablet TAKE 1 TABLET BY MOUTH DAILY. GENERIC EQUIVALENT FOR CARDURA 90 tablet 3  . Flaxseed, Linseed, (FLAXSEED OIL PO) Take by mouth. Takes 1400 mg daily    . Multiple Vitamin (MULTIVITAMIN) tablet Take 1 tablet by mouth daily.    . Thiamine HCl (VITAMIN B-1 PO) Take by mouth daily.    . vitamin C (ASCORBIC ACID) 500 MG tablet Take 500 mg by mouth daily.    Marland Kitchen doxazosin (CARDURA) 4 MG tablet TAKE 1 TABLET BY MOUTH DAILY. GENERIC EQUIVALENT FOR CARDURA 90 tablet 3   Facility-Administered Medications Prior to Visit  Medication Dose Route Frequency Provider Last Rate Last Dose  . 0.9 %  sodium chloride infusion  500 mL Intravenous Once Ladene Artist, MD        ROS: Review of Systems  Constitutional: Negative for appetite change, fatigue and unexpected weight change.  HENT: Negative for congestion, nosebleeds, sneezing, sore throat and trouble swallowing.   Eyes: Negative for itching and visual disturbance.  Respiratory: Negative for cough.   Cardiovascular: Negative for chest pain, palpitations and leg swelling.  Gastrointestinal: Negative for abdominal distention, blood in stool, diarrhea and nausea.  Genitourinary: Negative for frequency and hematuria.  Musculoskeletal: Negative for back pain, gait problem, joint swelling and neck pain.  Skin: Negative for rash.  Neurological: Negative for dizziness, tremors, speech difficulty and weakness.  Psychiatric/Behavioral: Negative for agitation, dysphoric mood, sleep disturbance and suicidal ideas. The patient is not nervous/anxious.      Objective:  BP 122/68 (BP Location: Left Arm, Patient Position: Sitting, Cuff Size: Normal)   Pulse 87   Temp 97.6 F (36.4 C) (Oral)   Ht 6' (1.829 m)   Wt 188 lb (85.3 kg)   SpO2 96%   BMI 25.50 kg/m   BP Readings from Last 3 Encounters:  10/27/18 122/68  11/17/17 127/73  10/27/17 132/74    Wt Readings from Last 3 Encounters:  10/27/18 188 lb (85.3 kg)  11/17/17 209 lb (94.8 kg)  11/03/17 209 lb (94.8 kg)    Physical Exam Constitutional:      General: He is not in acute distress.    Appearance: He is well-developed.     Comments: NAD  Eyes:     Conjunctiva/sclera: Conjunctivae normal.     Pupils: Pupils are equal, round, and reactive to light.  Neck:     Musculoskeletal: Normal range of motion.     Thyroid: No thyromegaly.     Vascular: No JVD.  Cardiovascular:     Rate and Rhythm: Normal rate and regular rhythm.     Heart sounds: Normal heart sounds. No murmur. No friction rub. No gallop.   Pulmonary:     Effort: Pulmonary effort is normal. No respiratory distress.     Breath sounds: Normal breath sounds. No wheezing or rales.  Chest:     Chest wall: No tenderness.  Abdominal:     General: Bowel sounds are normal. There is no distension.     Palpations: Abdomen is soft. There is no mass.  Tenderness: There is no abdominal tenderness. There is no guarding or rebound.  Genitourinary:    Rectum: Normal. Guaiac result negative.  Musculoskeletal: Normal range of motion.        General: No tenderness.  Lymphadenopathy:     Cervical: No cervical adenopathy.  Skin:    General: Skin is warm and dry.     Findings: No rash.  Neurological:     Mental Status: He is alert and oriented to person, place, and time.     Cranial Nerves: No cranial nerve deficit.     Motor: No abnormal muscle tone.     Coordination: Coordination normal.     Gait: Gait normal.     Deep Tendon Reflexes: Reflexes are normal and symmetric.  Psychiatric:        Behavior: Behavior normal.         Thought Content: Thought content normal.        Judgment: Judgment normal.   prostate 1+ AKs R foot and scalp   Procedure Note :     Procedure : Cryosurgery   Indication:  Actinic keratosis(es)   Risks including unsuccessful procedure , bleeding, infection, bruising, scar, a need for a repeat  procedure and others were explained to the patient in detail as well as the benefits. Informed consent was obtained verbally.    5 lesion(s)  on R foot (1) and scalp (4)   was/were treated with liquid nitrogen on a Q-tip in a usual fasion . Band-Aid was applied and antibiotic ointment was given for a later use.   Tolerated well. Complications none.   Postprocedure instructions :     Keep the wounds clean. You can wash them with liquid soap and water. Pat dry with gauze or a Kleenex tissue  Before applying antibiotic ointment and a Band-Aid.   You need to report immediately  if  any signs of infection develop.     Lab Results  Component Value Date   WBC 4.2 10/25/2017   HGB 13.5 10/25/2017   HCT 38.1 (L) 10/25/2017   PLT 128.0 (L) 10/25/2017   GLUCOSE 99 10/25/2017   CHOL 149 10/25/2017   TRIG 139.0 10/25/2017   HDL 38.10 (L) 10/25/2017   LDLDIRECT 80.9 04/16/2011   LDLCALC 84 10/25/2017   ALT 14 10/25/2017   AST 13 10/25/2017   NA 138 10/25/2017   K 4.4 10/25/2017   CL 102 10/25/2017   CREATININE 1.09 10/25/2017   BUN 21 10/25/2017   CO2 30 10/25/2017   TSH 1.67 10/25/2017   PSA 0.88 10/25/2017   HGBA1C 5.3 09/25/2013    No results found.  Assessment & Plan:    Walker Kehr, MD

## 2018-10-27 NOTE — Patient Instructions (Addendum)
If you have medicare related insurance (such as traditional Medicare, Blue Cross Medicare, United HealthCare Medicare, or similar), Please make an appointment at the scheduling desk with Jill, the Wellness Health Coach, for your Wellness visit in this office, which is a benefit with your insurance.    Cardiac CT calcium scoring test $150   Computed tomography, more commonly known as a CT or CAT scan, is a diagnostic medical imaging test. Like traditional x-rays, it produces multiple images or pictures of the inside of the body. The cross-sectional images generated during a CT scan can be reformatted in multiple planes. They can even generate three-dimensional images. These images can be viewed on a computer monitor, printed on film or by a 3D printer, or transferred to a CD or DVD. CT images of internal organs, bones, soft tissue and blood vessels provide greater detail than traditional x-rays, particularly of soft tissues and blood vessels. A cardiac CT scan for coronary calcium is a non-invasive way of obtaining information about the presence, location and extent of calcified plaque in the coronary arteries-the vessels that supply oxygen-containing blood to the heart muscle. Calcified plaque results when there is a build-up of fat and other substances under the inner layer of the artery. This material can calcify which signals the presence of atherosclerosis, a disease of the vessel wall, also called coronary artery disease (CAD). People with this disease have an increased risk for heart attacks. In addition, over time, progression of plaque build up (CAD) can narrow the arteries or even close off blood flow to the heart. The result may be chest pain, sometimes called "angina," or a heart attack. Because calcium is a marker of CAD, the amount of calcium detected on a cardiac CT scan is a helpful prognostic tool. The findings on cardiac CT are expressed as a calcium score. Another name for this test is  coronary artery calcium scoring.  What are some common uses of the procedure? The goal of cardiac CT scan for calcium scoring is to determine if CAD is present and to what extent, even if there are no symptoms. It is a screening study that may be recommended by a physician for patients with risk factors for CAD but no clinical symptoms. The major risk factors for CAD are: . high blood cholesterol levels  . family history of heart attacks  . diabetes  . high blood pressure  . cigarette smoking  . overweight or obese  . physical inactivity   A negative cardiac CT scan for calcium scoring shows no calcification within the coronary arteries. This suggests that CAD is absent or so minimal it cannot be seen by this technique. The chance of having a heart attack over the next two to five years is very low under these circumstances. A positive test means that CAD is present, regardless of whether or not the patient is experiencing any symptoms. The amount of calcification-expressed as the calcium score-may help to predict the likelihood of a myocardial infarction (heart attack) in the coming years and helps your medical doctor or cardiologist decide whether the patient may need to take preventive medicine or undertake other measures such as diet and exercise to lower the risk for heart attack. The extent of CAD is graded according to your calcium score:  Calcium Score  Presence of CAD (coronary artery disease)  0 No evidence of CAD   1-10 Minimal evidence of CAD  11-100 Mild evidence of CAD  101-400 Moderate evidence of CAD  Over   400 Extensive evidence of CAD    

## 2018-10-27 NOTE — Assessment & Plan Note (Signed)
See cryo 

## 2019-01-10 ENCOUNTER — Other Ambulatory Visit: Payer: Self-pay

## 2019-03-09 ENCOUNTER — Ambulatory Visit: Payer: Medicare Other | Attending: Internal Medicine

## 2019-03-09 DIAGNOSIS — Z23 Encounter for immunization: Secondary | ICD-10-CM | POA: Insufficient documentation

## 2019-03-09 NOTE — Progress Notes (Signed)
   Covid-19 Vaccination Clinic  Name:  Richard [name]    MRN: [id number] DOB: 12-08-1950  03/09/2019  Mr. Richard [name] was observed post Covid-19 immunization for 15 minutes without incidence. He was provided with Vaccine Information Sheet and instruction to access the V-Safe system.   Mr. Richard [name] was instructed to call 911 with any severe reactions post vaccine: Marland Kitchen Difficulty breathing  . Swelling of your face and throat  . A fast heartbeat  . A bad rash all over your body  . Dizziness and weakness    Immunizations Administered    Name Date Dose VIS Date Route   Pfizer COVID-19 Vaccine 03/09/2019  1:34 PM 0.3 mL 01/27/2019 Intramuscular   Manufacturer: [place]   Lot: [id number]   [place]: [id number]

## 2019-03-30 ENCOUNTER — Ambulatory Visit: Payer: Medicare Other | Attending: Internal Medicine

## 2019-03-30 DIAGNOSIS — Z23 Encounter for immunization: Secondary | ICD-10-CM | POA: Insufficient documentation

## 2019-03-30 NOTE — Progress Notes (Signed)
   Covid-19 Vaccination Clinic  Name:  DECORION VOTH    MRN: [id number] DOB: Sep 11, 1950  03/30/2019  Mr. Lease was observed post Covid-19 immunization for 15 minutes without incidence. He was provided with Vaccine Information Sheet and instruction to access the V-Safe system.   Mr. Joyner was instructed to call 911 with any severe reactions post vaccine: Marland Kitchen Difficulty breathing  . Swelling of your face and throat  . A fast heartbeat  . A bad rash all over your body  . Dizziness and weakness    Immunizations Administered    Name Date Dose VIS Date Route   Pfizer COVID-19 Vaccine 03/30/2019  2:21 PM 0.3 mL 01/27/2019 Intramuscular   Manufacturer: [employer], [employer]   Lot: [id number]   [place]: [id number]

## 2019-04-09 ENCOUNTER — Ambulatory Visit: Payer: Medicare Other

## 2019-07-04 ENCOUNTER — Ambulatory Visit (INDEPENDENT_AMBULATORY_CARE_PROVIDER_SITE_OTHER): Payer: Medicare Other | Admitting: Internal Medicine

## 2019-07-04 ENCOUNTER — Other Ambulatory Visit: Payer: Self-pay

## 2019-07-04 ENCOUNTER — Other Ambulatory Visit (INDEPENDENT_AMBULATORY_CARE_PROVIDER_SITE_OTHER): Payer: Medicare Other

## 2019-07-04 ENCOUNTER — Ambulatory Visit (INDEPENDENT_AMBULATORY_CARE_PROVIDER_SITE_OTHER)
Admission: RE | Admit: 2019-07-04 | Discharge: 2019-07-04 | Disposition: A | Discharge status: Home / Self Care | Payer: Medicare Other | Source: Ambulatory Visit | Attending: Internal Medicine | Admitting: Internal Medicine

## 2019-07-04 ENCOUNTER — Encounter: Payer: Self-pay | Admitting: Internal Medicine

## 2019-07-04 VITALS — BP 114/62 | HR 60 | Temp 98.0°F | Ht 72.0 in | Wt 199.0 lb

## 2019-07-04 DIAGNOSIS — N400 Enlarged prostate without lower urinary tract symptoms: Secondary | ICD-10-CM | POA: Diagnosis not present

## 2019-07-04 DIAGNOSIS — G8929 Other chronic pain: Secondary | ICD-10-CM

## 2019-07-04 DIAGNOSIS — L570 Actinic keratosis: Secondary | ICD-10-CM

## 2019-07-04 DIAGNOSIS — M545 Low back pain: Secondary | ICD-10-CM | POA: Diagnosis not present

## 2019-07-04 DIAGNOSIS — D696 Thrombocytopenia, unspecified: Secondary | ICD-10-CM | POA: Insufficient documentation

## 2019-07-04 DIAGNOSIS — E785 Hyperlipidemia, unspecified: Secondary | ICD-10-CM | POA: Insufficient documentation

## 2019-07-04 LAB — CBC WITH DIFFERENTIAL/PLATELET
Basophils Absolute: 0 10*3/uL (ref 0.0–0.1)
Basophils Relative: 0.5 % (ref 0.0–3.0)
Eosinophils Absolute: 0.1 10*3/uL (ref 0.0–0.7)
Eosinophils Relative: 2.1 % (ref 0.0–5.0)
HCT: 38.8 % — ABNORMAL LOW (ref 39.0–52.0)
Hemoglobin: 13.6 g/dL (ref 13.0–17.0)
Lymphocytes Relative: 32.3 % (ref 12.0–46.0)
Lymphs Abs: 1.2 10*3/uL (ref 0.7–4.0)
MCHC: 35.1 g/dL (ref 30.0–36.0)
MCV: 93 fl (ref 78.0–100.0)
Monocytes Absolute: 0.5 10*3/uL (ref 0.1–1.0)
Monocytes Relative: 12.9 % — ABNORMAL HIGH (ref 3.0–12.0)
Neutro Abs: 1.9 10*3/uL (ref 1.4–7.7)
Neutrophils Relative %: 52.2 % (ref 43.0–77.0)
Platelets: 126 10*3/uL — ABNORMAL LOW (ref 150.0–400.0)
RBC: 4.17 Mil/uL — ABNORMAL LOW (ref 4.22–5.81)
RDW: 13.2 % (ref 11.5–15.5)
WBC: 3.6 10*3/uL — ABNORMAL LOW (ref 4.0–10.5)

## 2019-07-04 LAB — BASIC METABOLIC PANEL
BUN: 30 mg/dL — ABNORMAL HIGH (ref 6–23)
CO2: 29 mEq/L (ref 19–32)
Calcium: 9.1 mg/dL (ref 8.4–10.5)
Chloride: 103 mEq/L (ref 96–112)
Creatinine, Ser: 1.05 mg/dL (ref 0.40–1.50)
GFR: 69.84 mL/min (ref >60.00)
Glucose, Bld: 104 mg/dL — ABNORMAL HIGH (ref 70–99)
Potassium: 4.1 mEq/L (ref 3.5–5.1)
Sodium: 136 mEq/L (ref 135–145)

## 2019-07-04 MED ORDER — TRIAMCINOLONE ACETONIDE 0.1 % EX CREA: 1.0000 "application " | TOPICAL_CREAM | Freq: Two times a day (BID) | CUTANEOUS | 0 refills | Status: DC

## 2019-07-04 NOTE — Assessment & Plan Note (Signed)
Doxazosinat night

## 2019-07-04 NOTE — Assessment & Plan Note (Signed)
Pt declined cryo Triamc cream prn

## 2019-07-04 NOTE — Progress Notes (Signed)
Subjective:  Patient ID: Richard [name], male    DOB: 1950/11/06  Age: 69 y.o. MRN: [id number]  CC: No chief complaint on file.   HPI Richard [name] presents for abn CBC. Not on ASA F/u LBP off and on x months, worse w/ROM. Using occ advil  Outpatient Medications Prior to Visit  Medication Sig Dispense Refill  . Cholecalciferol (VITAMIN D) 2000 UNITS CAPS Take by mouth daily.    Marland Kitchen doxazosin (CARDURA) 4 MG tablet TAKE 1 TABLET BY MOUTH DAILY. GENERIC EQUIVALENT FOR CARDURA 90 tablet 3  . Flaxseed, Linseed, (FLAXSEED OIL PO) Take by mouth. Takes 1400 mg daily    . Multiple Vitamin (MULTIVITAMIN) tablet Take 1 tablet by mouth daily.    . Thiamine HCl (VITAMIN B-1 PO) Take by mouth daily.    . vitamin C (ASCORBIC ACID) 500 MG tablet Take 500 mg by mouth daily.     Facility-Administered Medications Prior to Visit  Medication Dose Route Frequency Provider Last Rate Last Admin  . 0.9 %  sodium chloride infusion  500 mL Intravenous Once Ladene Artist, MD        ROS: Review of Systems  Constitutional: Negative for appetite change, fatigue and unexpected weight change.  HENT: Negative for congestion, nosebleeds, sneezing, sore throat and trouble swallowing.   Eyes: Negative for itching and visual disturbance.  Respiratory: Negative for cough.   Cardiovascular: Negative for chest pain, palpitations and leg swelling.  Gastrointestinal: Negative for abdominal distention, blood in stool, diarrhea and nausea.  Genitourinary: Negative for frequency and hematuria.  Musculoskeletal: Negative for back pain, gait problem, joint swelling and neck pain.  Skin: Negative for rash.  Neurological: Negative for dizziness, tremors, speech difficulty and weakness.  Psychiatric/Behavioral: Negative for agitation, dysphoric mood, sleep disturbance and suicidal ideas. The patient is not nervous/anxious.     Objective:  BP 114/62 (BP Location: Left Arm, Patient Position: Sitting, Cuff Size: Normal)    Pulse 60   Temp 98 F (36.7 C) (Oral)   Ht 6' (1.829 m)   Wt 199 lb (90.3 kg)   SpO2 95%   BMI 26.99 kg/m   BP Readings from Last 3 Encounters:  07/04/19 114/62  10/27/18 122/68  11/17/17 127/73    Wt Readings from Last 3 Encounters:  07/04/19 199 lb (90.3 kg)  10/27/18 188 lb (85.3 kg)  11/17/17 209 lb (94.8 kg)    Physical Exam Constitutional:      General: He is not in acute distress.    Appearance: He is well-developed.     Comments: NAD  Eyes:     Conjunctiva/sclera: Conjunctivae normal.     Pupils: Pupils are equal, round, and reactive to light.  Neck:     Thyroid: No thyromegaly.     Vascular: No JVD.  Cardiovascular:     Rate and Rhythm: Normal rate and regular rhythm.     Heart sounds: Normal heart sounds. No murmur. No friction rub. No gallop.   Pulmonary:     Effort: Pulmonary effort is normal. No respiratory distress.     Breath sounds: Normal breath sounds. No wheezing or rales.  Chest:     Chest wall: No tenderness.  Abdominal:     General: Bowel sounds are normal. There is no distension.     Palpations: Abdomen is soft. There is no mass.     Tenderness: There is no abdominal tenderness. There is no guarding or rebound.  Musculoskeletal:  General: No tenderness. Normal range of motion.     Cervical back: Normal range of motion.  Lymphadenopathy:     Cervical: No cervical adenopathy.  Skin:    General: Skin is warm and dry.     Findings: No rash.  Neurological:     Mental Status: He is alert and oriented to person, place, and time.     Cranial Nerves: No cranial nerve deficit.     Motor: No abnormal muscle tone.     Coordination: Coordination normal.     Gait: Gait normal.     Deep Tendon Reflexes: Reflexes are normal and symmetric.  Psychiatric:        Behavior: Behavior normal.        Thought Content: Thought content normal.        Judgment: Judgment normal.   AK forehead     Lab Results  Component Value Date   WBC 3.5 (L)  10/27/2018   HGB 13.0 10/27/2018   HCT 37.1 (L) 10/27/2018   PLT 112.0 (L) 10/27/2018   GLUCOSE 97 10/27/2018   CHOL 149 10/27/2018   TRIG 123.0 10/27/2018   HDL 40.30 10/27/2018   LDLDIRECT 80.9 04/16/2011   LDLCALC 84 10/27/2018   ALT 17 10/27/2018   AST 16 10/27/2018   NA 138 10/27/2018   K 4.3 10/27/2018   CL 104 10/27/2018   CREATININE 1.14 10/27/2018   BUN 25 (H) 10/27/2018   CO2 27 10/27/2018   TSH 2.40 10/27/2018   PSA 0.94 10/27/2018   HGBA1C 5.1 10/27/2018    No results found.  Assessment & Plan:    Walker Kehr, MD

## 2019-07-04 NOTE — Assessment & Plan Note (Addendum)
Worse LS X ray Tylenol prn

## 2019-07-04 NOTE — Patient Instructions (Addendum)
Back Exercises These exercises help to make your trunk and back strong. They also help to keep the lower back flexible. Doing these exercises can help to prevent back pain or lessen existing pain.  If you have back pain, try to do these exercises 2-3 times each day or as told by your doctor.  As you get better, do the exercises once each day. Repeat the exercises more often as told by your doctor.  To stop back pain from coming back, do the exercises once each day, or as told by your doctor. Exercises Single knee to chest Do these steps 3-5 times in a row for each leg: 1. Lie on your back on a firm bed or the floor with your legs stretched out. 2. Bring one knee to your chest. 3. Grab your knee or thigh with both hands and hold them it in place. 4. Pull on your knee until you feel a gentle stretch in your lower back or buttocks. 5. Keep doing the stretch for 10-30 seconds. 6. Slowly let go of your leg and straighten it. Pelvic tilt Do these steps 5-10 times in a row: 1. Lie on your back on a firm bed or the floor with your legs stretched out. 2. Bend your knees so they point up to the ceiling. Your feet should be flat on the floor. 3. Tighten your lower belly (abdomen) muscles to press your lower back against the floor. This will make your tailbone point up to the ceiling instead of pointing down to your feet or the floor. 4. Stay in this position for 5-10 seconds while you gently tighten your muscles and breathe evenly. Cat-cow Do these steps until your lower back bends more easily: 1. Get on your hands and knees on a firm surface. Keep your hands under your shoulders, and keep your knees under your hips. You may put padding under your knees. 2. Let your head hang down toward your chest. Tighten (contract) the muscles in your belly. Point your tailbone toward the floor so your lower back becomes rounded like the back of a cat. 3. Stay in this position for 5 seconds. 4. Slowly lift your  head. Let the muscles of your belly relax. Point your tailbone up toward the ceiling so your back forms a sagging arch like the back of a cow. 5. Stay in this position for 5 seconds.  Press-ups Do these steps 5-10 times in a row: 1. Lie on your belly (face-down) on the floor. 2. Place your hands near your head, about shoulder-width apart. 3. While you keep your back relaxed and keep your hips on the floor, slowly straighten your arms to raise the top half of your body and lift your shoulders. Do not use your back muscles. You may change where you place your hands in order to make yourself more comfortable. 4. Stay in this position for 5 seconds. 5. Slowly return to lying flat on the floor.  Bridges Do these steps 10 times in a row: 1. Lie on your back on a firm surface. 2. Bend your knees so they point up to the ceiling. Your feet should be flat on the floor. Your arms should be flat at your sides, next to your body. 3. Tighten your butt muscles and lift your butt off the floor until your waist is almost as high as your knees. If you do not feel the muscles working in your butt and the back of your thighs, slide your feet 1-2 inches   farther away from your butt. 4. Stay in this position for 3-5 seconds. 5. Slowly lower your butt to the floor, and let your butt muscles relax. If this exercise is too easy, try doing it with your arms crossed over your chest. Belly crunches Do these steps 5-10 times in a row: 1. Lie on your back on a firm bed or the floor with your legs stretched out. 2. Bend your knees so they point up to the ceiling. Your feet should be flat on the floor. 3. Cross your arms over your chest. 4. Tip your chin a little bit toward your chest but do not bend your neck. 5. Tighten your belly muscles and slowly raise your chest just enough to lift your shoulder blades a tiny bit off of the floor. Avoid raising your body higher than that, because it can put too much stress on your low  back. 6. Slowly lower your chest and your head to the floor. Back lifts Do these steps 5-10 times in a row: 1. Lie on your belly (face-down) with your arms at your sides, and rest your forehead on the floor. 2. Tighten the muscles in your legs and your butt. 3. Slowly lift your chest off of the floor while you keep your hips on the floor. Keep the back of your head in line with the curve in your back. Look at the floor while you do this. 4. Stay in this position for 3-5 seconds. 5. Slowly lower your chest and your face to the floor. Contact a doctor if:  Your back pain gets a lot worse when you do an exercise.  Your back pain does not get better 2 hours after you exercise. If you have any of these problems, stop doing the exercises. Do not do them again unless your doctor says it is okay. Get help right away if:  You have sudden, very bad back pain. If this happens, stop doing the exercises. Do not do them again unless your doctor says it is okay. This information is not intended to replace advice given to you by your health care provider. Make sure you discuss any questions you have with your health care provider. Document Revised: 10/28/2017 Document Reviewed: 10/28/2017 Elsevier Patient Education  2020 [place].  Cardiac CT calcium scoring test $150 Tel # is [phone]   Computed tomography, more commonly known as a CT or CAT scan, is a diagnostic medical imaging test. Like traditional x-rays, it produces multiple images or pictures of the inside of the body. The cross-sectional images generated during a CT scan can be reformatted in multiple planes. They can even generate three-dimensional images. These images can be viewed on a computer monitor, printed on film or by a 3D printer, or transferred to a CD or DVD. CT images of internal organs, bones, soft tissue and blood vessels provide greater detail than traditional x-rays, particularly of soft tissues and blood vessels. A  cardiac CT scan for coronary calcium is a non-invasive way of obtaining information about the presence, location and extent of calcified plaque in the coronary arteries--the vessels that supply oxygen-containing blood to the heart muscle. Calcified plaque results when there is a build-up of fat and other substances under the inner layer of the artery. This material can calcify which signals the presence of atherosclerosis, a disease of the vessel wall, also called coronary artery disease (CAD). People with this disease have an increased risk for heart attacks. In addition, over time, progression of plaque  build up (CAD) can narrow the arteries or even close off blood flow to the heart. The result may be chest pain, sometimes called "angina," or a heart attack. Because calcium is a marker of CAD, the amount of calcium detected on a cardiac CT scan is a helpful prognostic tool. The findings on cardiac CT are expressed as a calcium score. Another name for this test is coronary artery calcium scoring.  What are some common uses of the procedure? The goal of cardiac CT scan for calcium scoring is to determine if CAD is present and to what extent, even if there are no symptoms. It is a screening study that may be recommended by a physician for patients with risk factors for CAD but no clinical symptoms. The major risk factors for CAD are: . high blood cholesterol levels  . family history of heart attacks  . diabetes  . high blood pressure  . cigarette smoking  . overweight or obese  . physical inactivity   A negative cardiac CT scan for calcium scoring shows no calcification within the coronary arteries. This suggests that CAD is absent or so minimal it cannot be seen by this technique. The chance of having a heart attack over the next two to five years is very low under these circumstances. A positive test means that CAD is present, regardless of whether or not the patient is experiencing any symptoms.  The amount of calcification--expressed as the calcium score--may help to predict the likelihood of a myocardial infarction (heart attack) in the coming years and helps your medical doctor or cardiologist decide whether the patient may need to take preventive medicine or undertake other measures such as diet and exercise to lower the risk for heart attack. The extent of CAD is graded according to your calcium score:  Calcium Score  Presence of CAD (coronary artery disease)  0 No evidence of CAD   1-10 Minimal evidence of CAD  11-100 Mild evidence of CAD  101-400 Moderate evidence of CAD  Over 400 Extensive evidence of CAD

## 2019-07-04 NOTE — Assessment & Plan Note (Signed)
Mild Coronary calcium CT

## 2019-07-04 NOTE — Assessment & Plan Note (Signed)
Re-check CBC for decreased WBC, PLTs

## 2019-07-18 ENCOUNTER — Ambulatory Visit (INDEPENDENT_AMBULATORY_CARE_PROVIDER_SITE_OTHER)
Admission: RE | Admit: 2019-07-18 | Discharge: 2019-07-18 | Disposition: A | Discharge status: Home / Self Care | Payer: Self-pay | Source: Ambulatory Visit | Attending: Internal Medicine | Admitting: Internal Medicine

## 2019-07-18 ENCOUNTER — Other Ambulatory Visit: Payer: Self-pay

## 2019-07-18 DIAGNOSIS — E785 Hyperlipidemia, unspecified: Secondary | ICD-10-CM

## 2019-07-21 ENCOUNTER — Encounter: Payer: Self-pay | Admitting: Internal Medicine

## 2019-07-21 DIAGNOSIS — I2510 Atherosclerotic heart disease of native coronary artery without angina pectoris: Secondary | ICD-10-CM | POA: Insufficient documentation

## 2019-07-25 ENCOUNTER — Other Ambulatory Visit: Payer: Self-pay | Admitting: Internal Medicine

## 2019-07-25 DIAGNOSIS — E785 Hyperlipidemia, unspecified: Secondary | ICD-10-CM

## 2019-07-25 MED ORDER — ASPIRIN EC 81 MG PO TBEC: 81.0000 mg | DELAYED_RELEASE_TABLET | Freq: Every day | ORAL | 3 refills | Status: AC

## 2019-07-25 MED ORDER — ROSUVASTATIN CALCIUM 5 MG PO TABS
5.0000 mg | ORAL_TABLET | Freq: Every day | ORAL | 3 refills | Status: DC
Start: 2019-07-25 — End: 2020-06-10

## 2019-07-31 DIAGNOSIS — H5203 Hypermetropia, bilateral: Secondary | ICD-10-CM | POA: Diagnosis not present

## 2019-07-31 DIAGNOSIS — H524 Presbyopia: Secondary | ICD-10-CM | POA: Diagnosis not present

## 2019-07-31 DIAGNOSIS — H52222 Regular astigmatism, left eye: Secondary | ICD-10-CM | POA: Diagnosis not present

## 2019-10-27 DIAGNOSIS — Z23 Encounter for immunization: Secondary | ICD-10-CM | POA: Diagnosis not present

## 2019-10-30 ENCOUNTER — Encounter: Payer: Self-pay | Admitting: Internal Medicine

## 2019-10-30 ENCOUNTER — Other Ambulatory Visit: Payer: Self-pay

## 2019-10-30 ENCOUNTER — Ambulatory Visit (INDEPENDENT_AMBULATORY_CARE_PROVIDER_SITE_OTHER): Payer: Medicare Other | Admitting: Internal Medicine

## 2019-10-30 VITALS — BP 110/75 | HR 67 | Temp 97.9°F | Ht 72.0 in | Wt 197.0 lb

## 2019-10-30 DIAGNOSIS — I2583 Coronary atherosclerosis due to lipid rich plaque: Secondary | ICD-10-CM

## 2019-10-30 DIAGNOSIS — N400 Enlarged prostate without lower urinary tract symptoms: Secondary | ICD-10-CM | POA: Diagnosis not present

## 2019-10-30 DIAGNOSIS — R7309 Other abnormal glucose: Secondary | ICD-10-CM | POA: Diagnosis not present

## 2019-10-30 DIAGNOSIS — I2510 Atherosclerotic heart disease of native coronary artery without angina pectoris: Secondary | ICD-10-CM | POA: Diagnosis not present

## 2019-10-30 DIAGNOSIS — E785 Hyperlipidemia, unspecified: Secondary | ICD-10-CM | POA: Diagnosis not present

## 2019-10-30 DIAGNOSIS — N320 Bladder-neck obstruction: Secondary | ICD-10-CM

## 2019-10-30 MED ORDER — DOXAZOSIN MESYLATE 4 MG PO TABS: ORAL_TABLET | ORAL | 3 refills | Status: DC

## 2019-10-30 NOTE — Assessment & Plan Note (Signed)
Take Crestor 3 per wk to see if arthralgias are better

## 2019-10-30 NOTE — Addendum Note (Signed)
Addended by: Cresenciano Lick on: 10/30/2019 08:50 AM   Modules accepted: Orders

## 2019-10-30 NOTE — Assessment & Plan Note (Signed)
Take Doxazosin 1/2 tab at hs - low BP. No sx's

## 2019-10-30 NOTE — Progress Notes (Signed)
Subjective:  Patient ID: Richard [name], male    DOB: 1950-09-06  Age: 69 y.o. MRN: [id number]  CC: No chief complaint on file.   HPI SON BARKAN presents for BPH, dyslipidemia, elev glu  Outpatient Medications Prior to Visit  Medication Sig Dispense Refill  . aspirin EC 81 MG tablet Take 1 tablet (81 mg total) by mouth daily. 100 tablet 3  . b complex vitamins capsule Take 1 capsule by mouth daily.    . Cholecalciferol (VITAMIN D) 2000 UNITS CAPS Take by mouth daily.    . Flaxseed, Linseed, (FLAXSEED OIL PO) Take by mouth. Takes 1400 mg daily    . Multiple Vitamin (MULTIVITAMIN) tablet Take 1 tablet by mouth daily.    . rosuvastatin (CRESTOR) 5 MG tablet Take 1 tablet (5 mg total) by mouth daily. 90 tablet 3  . Thiamine HCl (VITAMIN B-1 PO) Take by mouth daily.    Marland Kitchen triamcinolone cream (KENALOG) 0.1 % Apply 1 application topically 2 (two) times daily. 45 g 0  . Turmeric (QC TUMERIC COMPLEX PO) Take by mouth.    . vitamin C (ASCORBIC ACID) 500 MG tablet Take 500 mg by mouth daily.    Marland Kitchen doxazosin (CARDURA) 4 MG tablet TAKE 1 TABLET BY MOUTH DAILY. GENERIC EQUIVALENT FOR CARDURA 90 tablet 3   Facility-Administered Medications Prior to Visit  Medication Dose Route Frequency Provider Last Rate Last Admin  . 0.9 %  sodium chloride infusion  500 mL Intravenous Once Ladene Artist, MD        ROS: Review of Systems  Constitutional: Negative for appetite change, fatigue and unexpected weight change.  HENT: Negative for congestion, nosebleeds, sneezing, sore throat and trouble swallowing.   Eyes: Negative for itching and visual disturbance.  Respiratory: Negative for cough.   Cardiovascular: Negative for chest pain, palpitations and leg swelling.  Gastrointestinal: Negative for abdominal distention, blood in stool, diarrhea and nausea.  Genitourinary: Negative for frequency and hematuria.  Musculoskeletal: Positive for arthralgias. Negative for back pain, gait problem, joint  swelling and neck pain.  Skin: Negative for rash.  Neurological: Negative for dizziness, tremors, speech difficulty and weakness.  Psychiatric/Behavioral: Negative for agitation, dysphoric mood and sleep disturbance. The patient is not nervous/anxious.     Objective:  BP 110/75   Pulse 67   Temp 97.9 F (36.6 C) (Oral)   Ht 6' (1.829 m)   Wt 197 lb (89.4 kg)   SpO2 96%   BMI 26.72 kg/m   BP Readings from Last 3 Encounters:  10/30/19 110/75  07/04/19 114/62  10/27/18 122/68    Wt Readings from Last 3 Encounters:  10/30/19 197 lb (89.4 kg)  07/04/19 199 lb (90.3 kg)  10/27/18 188 lb (85.3 kg)    Physical Exam Constitutional:      General: He is not in acute distress.    Appearance: He is well-developed.     Comments: NAD  Eyes:     Conjunctiva/sclera: Conjunctivae normal.     Pupils: Pupils are equal, round, and reactive to light.  Neck:     Thyroid: No thyromegaly.     Vascular: No JVD.  Cardiovascular:     Rate and Rhythm: Normal rate and regular rhythm.     Heart sounds: Normal heart sounds. No murmur heard.  No friction rub. No gallop.   Pulmonary:     Effort: Pulmonary effort is normal. No respiratory distress.     Breath sounds: Normal breath sounds. No wheezing or rales.  Chest:     Chest wall: No tenderness.  Abdominal:     General: Bowel sounds are normal. There is no distension.     Palpations: Abdomen is soft. There is no mass.     Tenderness: There is no abdominal tenderness. There is no guarding or rebound.  Musculoskeletal:        General: No tenderness. Normal range of motion.     Cervical back: Normal range of motion.  Lymphadenopathy:     Cervical: No cervical adenopathy.  Skin:    General: Skin is warm and dry.     Findings: No rash.  Neurological:     Mental Status: He is alert and oriented to person, place, and time.     Cranial Nerves: No cranial nerve deficit.     Motor: No abnormal muscle tone.     Coordination: Coordination  normal.     Gait: Gait normal.     Deep Tendon Reflexes: Reflexes are normal and symmetric.  Psychiatric:        Behavior: Behavior normal.        Thought Content: Thought content normal.        Judgment: Judgment normal.     Lab Results  Component Value Date   WBC 3.6 (L) 07/04/2019   HGB 13.6 07/04/2019   HCT 38.8 (L) 07/04/2019   PLT 126.0 (L) 07/04/2019   GLUCOSE 104 (H) 07/04/2019   CHOL 149 10/27/2018   TRIG 123.0 10/27/2018   HDL 40.30 10/27/2018   LDLDIRECT 80.9 04/16/2011   LDLCALC 84 10/27/2018   ALT 17 10/27/2018   AST 16 10/27/2018   NA 136 07/04/2019   K 4.1 07/04/2019   CL 103 07/04/2019   CREATININE 1.05 07/04/2019   BUN 30 (H) 07/04/2019   CO2 29 07/04/2019   TSH 2.40 10/27/2018   PSA 0.94 10/27/2018   HGBA1C 5.1 10/27/2018    CT CARDIAC SCORING  Addendum Date: 07/18/2019   ADDENDUM REPORT: 07/18/2019 15:15 EXAM: OVER-READ INTERPRETATION  CT CHEST The following report is an over-read performed by radiologist Dr. Rebekah Chesterfield [facility] on 07/18/2019. This over-read does not include interpretation of cardiac or coronary anatomy or pathology. The coronary calcium score interpretation by the cardiologist is attached. COMPARISON:  None. FINDINGS: Calcified granuloma in the right lower lobe. Within the visualized portions of the thorax there are no other suspicious appearing pulmonary nodules or masses, there is no acute consolidative airspace disease, no pleural effusions, no pneumothorax and no lymphadenopathy. Visualized portions of the upper abdomen are unremarkable. There are no aggressive appearing lytic or blastic lesions noted in the visualized portions of the skeleton. IMPRESSION: 1. Calcified granuloma in the right lower lobe incidentally noted. No other significant incidental noncardiac findings are noted. Electronically Signed   By: Vinnie Langton M.D.   On: 07/18/2019 15:15   Result Date: 07/18/2019 CLINICAL DATA:  Risk stratification  EXAM: Coronary Calcium Score TECHNIQUE: The patient was scanned on a [employer]. Axial non-contrast 3 mm slices were carried out through the heart. The data set was analyzed on a dedicated work station and scored using the [place]. FINDINGS: Non-cardiac: See separate report from [facility]. Ascending Aorta: Normal Caliber.  No Calcifications. Pericardium: Normal Coronary arteries: Normal coronary origins. IMPRESSION: Coronary calcium score of 73. This was 40th percentile for age and sex matched control. Fransico Him Electronically Signed: By: Fransico Him On: 07/18/2019 14:51    Assessment & Plan:    Walker Kehr, MD

## 2019-10-30 NOTE — Assessment & Plan Note (Signed)
Labs

## 2019-10-30 NOTE — Patient Instructions (Signed)
[place] started vaccine booster sign up. Please call [place] Vaccine Line at [phone]. You can also call the venue where you had your initial COVID 19 vaccination. Thanks, AP

## 2019-10-31 LAB — CBC WITH DIFFERENTIAL/PLATELET
Absolute Monocytes: 474 cells/uL (ref 200–950)
Basophils Absolute: 9 cells/uL (ref 0–200)
Basophils Relative: 0.3 %
Eosinophils Absolute: 102 cells/uL (ref 15–500)
Eosinophils Relative: 3.3 %
HCT: 37.8 % — ABNORMAL LOW (ref 38.5–50.0)
Hemoglobin: 13 g/dL — ABNORMAL LOW (ref 13.2–17.1)
Lymphs Abs: 970 cells/uL (ref 850–3900)
MCH: 32.7 pg (ref 27.0–33.0)
MCHC: 34.4 g/dL (ref 32.0–36.0)
MCV: 95 fL (ref 80.0–100.0)
MPV: 11.1 fL (ref 7.5–12.5)
Monocytes Relative: 15.3 %
Neutro Abs: 1544 cells/uL (ref 1500–7800)
Neutrophils Relative %: 49.8 %
Platelets: 118 10*3/uL — ABNORMAL LOW (ref 140–400)
RBC: 3.98 10*6/uL — ABNORMAL LOW (ref 4.20–5.80)
RDW: 12.5 % (ref 11.0–15.0)
Total Lymphocyte: 31.3 %
WBC: 3.1 10*3/uL — ABNORMAL LOW (ref 3.8–10.8)

## 2019-10-31 LAB — COMPLETE METABOLIC PANEL WITH GFR
AG Ratio: 2 (calc) (ref 1.0–2.5)
ALT: 21 U/L (ref 9–46)
AST: 19 U/L (ref 10–35)
Albumin: 4.5 g/dL (ref 3.6–5.1)
Alkaline phosphatase (APISO): 62 U/L (ref 35–144)
BUN/Creatinine Ratio: 26 (calc) — ABNORMAL HIGH (ref 6–22)
BUN: 29 mg/dL — ABNORMAL HIGH (ref 7–25)
CO2: 27 mmol/L (ref 20–32)
Calcium: 8.8 mg/dL (ref 8.6–10.3)
Chloride: 104 mmol/L (ref 98–110)
Creat: 1.13 mg/dL (ref 0.70–1.18)
GFR, Est African American: 76 mL/min/{1.73_m2} (ref >=60)
GFR, Est Non African American: 65 mL/min/{1.73_m2} (ref >=60)
Globulin: 2.3 g/dL (calc) (ref 1.9–3.7)
Glucose, Bld: 97 mg/dL (ref 65–99)
Potassium: 4.6 mmol/L (ref 3.5–5.3)
Sodium: 139 mmol/L (ref 135–146)
Total Bilirubin: 0.8 mg/dL (ref 0.2–1.2)
Total Protein: 6.8 g/dL (ref 6.1–8.1)

## 2019-10-31 LAB — URINALYSIS
Bilirubin Urine: NEGATIVE
Glucose, UA: NEGATIVE
Hgb urine dipstick: NEGATIVE
Ketones, ur: NEGATIVE
Leukocytes,Ua: NEGATIVE
Nitrite: NEGATIVE
Protein, ur: NEGATIVE
Specific Gravity, Urine: 1.024 (ref 1.001–1.03)
pH: <=5 (ref 5.0–8.0)

## 2019-10-31 LAB — LIPID PANEL
Cholesterol: 122 mg/dL (ref <200)
HDL: 44 mg/dL (ref >=40)
LDL Cholesterol (Calc): 61 mg/dL (calc)
Non-HDL Cholesterol (Calc): 78 mg/dL (calc) (ref <130)
Total CHOL/HDL Ratio: 2.8 (calc) (ref <5.0)
Triglycerides: 83 mg/dL (ref <150)

## 2019-10-31 LAB — TSH: TSH: 2.18 mIU/L (ref 0.40–4.50)

## 2019-10-31 LAB — PSA: PSA: 0.7 ng/mL (ref <=4.0)

## 2019-11-01 ENCOUNTER — Other Ambulatory Visit: Payer: Self-pay | Admitting: Internal Medicine

## 2019-11-01 DIAGNOSIS — D61818 Other pancytopenia: Secondary | ICD-10-CM | POA: Insufficient documentation

## 2019-11-01 NOTE — Assessment & Plan Note (Signed)
?  etiology Hem ref - Dr Marin Olp

## 2019-11-21 ENCOUNTER — Other Ambulatory Visit: Payer: Self-pay

## 2019-11-21 ENCOUNTER — Ambulatory Visit: Payer: Medicare Other | Attending: Internal Medicine

## 2019-11-21 ENCOUNTER — Other Ambulatory Visit (HOSPITAL_BASED_OUTPATIENT_CLINIC_OR_DEPARTMENT_OTHER): Payer: Self-pay | Admitting: Internal Medicine

## 2019-11-21 DIAGNOSIS — Z23 Encounter for immunization: Secondary | ICD-10-CM

## 2019-11-28 ENCOUNTER — Other Ambulatory Visit: Payer: Self-pay | Admitting: Family

## 2019-11-28 DIAGNOSIS — D61818 Other pancytopenia: Secondary | ICD-10-CM

## 2019-11-28 DIAGNOSIS — D649 Anemia, unspecified: Secondary | ICD-10-CM

## 2019-11-28 DIAGNOSIS — E538 Deficiency of other specified B group vitamins: Secondary | ICD-10-CM

## 2019-11-29 ENCOUNTER — Other Ambulatory Visit: Payer: Self-pay

## 2019-11-29 ENCOUNTER — Inpatient Hospital Stay: Payer: Medicare Other

## 2019-11-29 ENCOUNTER — Encounter: Payer: Self-pay | Admitting: Family

## 2019-11-29 ENCOUNTER — Inpatient Hospital Stay: Payer: Medicare Other | Attending: Family | Admitting: Family

## 2019-11-29 VITALS — BP 116/71 | HR 63 | Temp 97.9°F | Resp 18 | Ht 72.0 in | Wt 203.8 lb

## 2019-11-29 DIAGNOSIS — D649 Anemia, unspecified: Secondary | ICD-10-CM | POA: Insufficient documentation

## 2019-11-29 DIAGNOSIS — M545 Low back pain, unspecified: Secondary | ICD-10-CM | POA: Insufficient documentation

## 2019-11-29 DIAGNOSIS — R42 Dizziness and giddiness: Secondary | ICD-10-CM | POA: Diagnosis not present

## 2019-11-29 DIAGNOSIS — Z87891 Personal history of nicotine dependence: Secondary | ICD-10-CM | POA: Diagnosis not present

## 2019-11-29 DIAGNOSIS — D61818 Other pancytopenia: Secondary | ICD-10-CM

## 2019-11-29 DIAGNOSIS — N400 Enlarged prostate without lower urinary tract symptoms: Secondary | ICD-10-CM | POA: Diagnosis not present

## 2019-11-29 DIAGNOSIS — R5383 Other fatigue: Secondary | ICD-10-CM | POA: Insufficient documentation

## 2019-11-29 DIAGNOSIS — Z8261 Family history of arthritis: Secondary | ICD-10-CM | POA: Diagnosis not present

## 2019-11-29 DIAGNOSIS — R252 Cramp and spasm: Secondary | ICD-10-CM | POA: Insufficient documentation

## 2019-11-29 DIAGNOSIS — E291 Testicular hypofunction: Secondary | ICD-10-CM | POA: Insufficient documentation

## 2019-11-29 DIAGNOSIS — Z886 Allergy status to analgesic agent status: Secondary | ICD-10-CM

## 2019-11-29 DIAGNOSIS — Z79899 Other long term (current) drug therapy: Secondary | ICD-10-CM | POA: Diagnosis not present

## 2019-11-29 DIAGNOSIS — E538 Deficiency of other specified B group vitamins: Secondary | ICD-10-CM

## 2019-11-29 LAB — CMP (CANCER CENTER ONLY)
ALT: 20 U/L (ref 0–44)
AST: 17 U/L (ref 15–41)
Albumin: 4.4 g/dL (ref 3.5–5.0)
Alkaline Phosphatase: 57 U/L (ref 38–126)
Anion gap: 5 (ref 5–15)
BUN: 37 mg/dL — ABNORMAL HIGH (ref 8–23)
CO2: 30 mmol/L (ref 22–32)
Calcium: 9.7 mg/dL (ref 8.9–10.3)
Chloride: 103 mmol/L (ref 98–111)
Creatinine: 1.15 mg/dL (ref 0.61–1.24)
GFR, Estimated: >60 mL/min (ref >60)
Glucose, Bld: 100 mg/dL — ABNORMAL HIGH (ref 70–99)
Potassium: 4.3 mmol/L (ref 3.5–5.1)
Sodium: 138 mmol/L (ref 135–145)
Total Bilirubin: 0.8 mg/dL (ref 0.3–1.2)
Total Protein: 6.7 g/dL (ref 6.5–8.1)

## 2019-11-29 LAB — FERRITIN: Ferritin: 156 ng/mL (ref 24–336)

## 2019-11-29 LAB — CBC WITH DIFFERENTIAL (CANCER CENTER ONLY)
Abs Immature Granulocytes: 0.01 10*3/uL (ref 0.00–0.07)
Basophils Absolute: 0 10*3/uL (ref 0.0–0.1)
Basophils Relative: 0 %
Eosinophils Absolute: 0.1 10*3/uL (ref 0.0–0.5)
Eosinophils Relative: 3 %
HCT: 35.9 % — ABNORMAL LOW (ref 39.0–52.0)
Hemoglobin: 12.3 g/dL — ABNORMAL LOW (ref 13.0–17.0)
Immature Granulocytes: 0 %
Lymphocytes Relative: 29 %
Lymphs Abs: 1.2 10*3/uL (ref 0.7–4.0)
MCH: 31.8 pg (ref 26.0–34.0)
MCHC: 34.3 g/dL (ref 30.0–36.0)
MCV: 92.8 fL (ref 80.0–100.0)
Monocytes Absolute: 0.5 10*3/uL (ref 0.1–1.0)
Monocytes Relative: 12 %
Neutro Abs: 2.3 10*3/uL (ref 1.7–7.7)
Neutrophils Relative %: 56 %
Platelet Count: 117 10*3/uL — ABNORMAL LOW (ref 150–400)
RBC: 3.87 MIL/uL — ABNORMAL LOW (ref 4.22–5.81)
RDW: 12.1 % (ref 11.5–15.5)
WBC Count: 4.1 10*3/uL (ref 4.0–10.5)
nRBC: 0 % (ref 0.0–0.2)

## 2019-11-29 LAB — RETICULOCYTES
Immature Retic Fract: 11.1 % (ref 2.3–15.9)
RBC.: 3.83 MIL/uL — ABNORMAL LOW (ref 4.22–5.81)
Retic Count, Absolute: 67.4 10*3/uL (ref 19.0–186.0)
Retic Ct Pct: 1.8 % (ref 0.4–3.1)

## 2019-11-29 LAB — PLATELET BY CITRATE

## 2019-11-29 LAB — IRON AND TIBC
Iron: 105 ug/dL (ref 42–163)
Saturation Ratios: 33 % (ref 20–55)
TIBC: 314 ug/dL (ref 202–409)
UIBC: 209 ug/dL (ref 117–376)

## 2019-11-29 LAB — FOLATE: Folate: 56.7 ng/mL (ref >5.9)

## 2019-11-29 LAB — LACTATE DEHYDROGENASE: LDH: 184 U/L (ref 98–192)

## 2019-11-29 LAB — SAVE SMEAR(SSMR), FOR PROVIDER SLIDE REVIEW

## 2019-11-29 LAB — VITAMIN B12: Vitamin B-12: 370 pg/mL (ref 180–914)

## 2019-11-29 NOTE — Progress Notes (Signed)
Hematology/Oncology Consultation   Name: Richard [name]      MRN: [id number]    Location: Room/bed info not found  Date: 11/29/2019 Time:11:08 AM   REFERRING PHYSICIAN: Lew Dawes, MD  REASON FOR CONSULT: Pancytopenia    DIAGNOSIS: Pancytopenia   HISTORY OF PRESENT ILLNESS: Richard [name] is a very pleasant 69 yo gentleman with a several year history of intermittent pancytopenia.  His WBC count today is 4.1, Hgb 12.3, MCV 92 and platelets 117.  Richard [name] notes occasional episodes or fatigue.  Richard [name] has had no issue with bleeding. No abnormal bruising or petechiae.  Richard [name] has had no issue with frequent infection. No fever, chills, n/v, cough, rash, SOB, chest pain, palpitations, abdominal pain or changes in bowel or bladder habits.  No known family history of abnormalities in the blood.  No personal history of cancer. His maternal grandfather - brain (smoker) and maternal grandmother - unknown primary.  Richard [name] has generalized joint aches and pains at times. Richard [name] also has right sided lower back pain due to an old strain from shoveling snow a year or two ago.  Richard [name] notes dizziness and has had syncope in the past when Richard [name] gets up too quickly.  Richard [name] went last week for his Covid booster with Pfizer as well as his flu shot.  No swelling, tenderness, numbness or tingling in his extremities.  Richard [name] has history of occasional cramping in his legs and feet. Richard [name] also notes this sometimes at night while in bed.  No falls to report.  Richard [name] has maintained a good appetite and is staying well hydrated. Her weight is stable.  Both Richard [name] and his wife are quite active walking around 2 miles each day. They also enjoy doing yard work.  Richard [name] retired a couple years ago from [employer].   ROS: All other 10 point review of systems is negative.   PAST MEDICAL HISTORY:   Past Medical History:  Diagnosis Date   BPH (benign prostatic hyperplasia)    Hypertension    Hypogonadism male    Mononucleosis    Mumps    Plantar  fasciitis    PONV (postoperative nausea and vomiting)    Zoster     ALLERGIES: Allergies  Allergen Reactions   Aspirin Rash    REACTION: causes rash,fever       MEDICATIONS:  Current Outpatient Medications on File Prior to Visit  Medication Sig Dispense Refill   aspirin EC 81 MG tablet Take 1 tablet (81 mg total) by mouth daily. 100 tablet 3   b complex vitamins capsule Take 1 capsule by mouth daily.     Cholecalciferol (VITAMIN D) 2000 UNITS CAPS Take by mouth daily.     doxazosin (CARDURA) 4 MG tablet TAKE 1 TABLET BY MOUTH DAILY. GENERIC EQUIVALENT FOR CARDURA 90 tablet 3   Flaxseed, Linseed, (FLAXSEED OIL PO) Take by mouth. Takes 1400 mg daily     Multiple Vitamin (MULTIVITAMIN) tablet Take 1 tablet by mouth daily.     rosuvastatin (CRESTOR) 5 MG tablet Take 1 tablet (5 mg total) by mouth daily. 90 tablet 3   Thiamine HCl (VITAMIN B-1 PO) Take by mouth daily.     triamcinolone cream (KENALOG) 0.1 % Apply 1 application topically 2 (two) times daily. 45 g 0   Turmeric (QC TUMERIC COMPLEX PO) Take by mouth.     vitamin C (ASCORBIC ACID) 500 MG tablet Take 500 mg by mouth daily.     Current Facility-Administered Medications on File Prior to Visit  Medication Dose Route Frequency Provider Last Rate Last Admin   0.9 %  sodium chloride infusion  500 mL Intravenous Once Ladene Artist, MD         PAST SURGICAL HISTORY Past Surgical History:  Procedure Laterality Date   circumsicion  2007   asa adult for balanitis w/ phimosis   COLONOSCOPY  2013   HERNIA REPAIR Left    INGUINAL HERNIA REPAIR  childhood   left   LIPOMA EXCISION N/A 02/19/2016   Procedure: EXCISION SUBCUTANEOUS LIPOMA BACK;  Surgeon: Donnie Mesa, MD;  Location: [place];  Service: General;  Laterality: N/A;   MOUTH SURGERY     early eruption of incisors requiring surgical extraction   POLYPECTOMY     TONSILLECTOMY  1959   WISDOM TOOTH EXTRACTION  1990    FAMILY  HISTORY: Family History  Problem Relation Age of Onset   Arthritis Mother        spinal stenosis s/p surgery   Diabetes Neg Hx    Heart disease Neg Hx    Colon cancer Neg Hx    Stomach cancer Neg Hx    Colon polyps Neg Hx    Rectal cancer Neg Hx     SOCIAL HISTORY:  reports that Richard [name] quit smoking about 52 years ago. Richard [name] has never used smokeless tobacco. Richard [name] reports that Richard [name] does not drink alcohol and does not use drugs.  PERFORMANCE STATUS: The patient's performance status is 0 - Asymptomatic  PHYSICAL EXAM: Most Recent Vital Signs: There were no vitals taken for this visit. BP 116/71 (BP Location: Left Arm, Patient Position: Sitting)    Pulse 63    Temp 97.9 F (36.6 C) (Oral)    Resp 18    Ht 6' (1.829 m)    Wt 203 lb 12.8 oz (92.4 kg)    SpO2 99%    BMI 27.64 kg/m   General Appearance:    Alert, cooperative, no distress, appears stated age  Head:    Normocephalic, without obvious abnormality, atraumatic  Eyes:    PERRL, conjunctiva/corneas clear, EOM's intact, fundi    benign, both eyes             Throat:   Lips, mucosa, and tongue normal; teeth and gums normal  Neck:   Supple, symmetrical, trachea midline, no adenopathy;       thyroid:  No enlargement/tenderness/nodules; no carotid   bruit or JVD  Back:     Symmetric, no curvature, ROM normal, no CVA tenderness  Lungs:     Clear to auscultation bilaterally, respirations unlabored  Chest wall:    No tenderness or deformity  Heart:    Regular rate and rhythm, S1 and S2 normal, no murmur, rub   or gallop  Abdomen:     Soft, non-tender, bowel sounds active all four quadrants,    no masses, no organomegaly        Extremities:   Extremities normal, atraumatic, no cyanosis or edema  Pulses:   2+ and symmetric all extremities  Skin:   Skin color, texture, turgor normal, no rashes or lesions  Lymph nodes:   Cervical, supraclavicular, and axillary nodes normal  Neurologic:   CNII-XII intact. Normal strength, sensation and  reflexes      throughout    LABORATORY DATA:  Results for orders placed or performed in visit on 11/29/19 (from the past 48 hour(s))  CBC with Differential ([place] Only)     Status: Abnormal   Collection  Time: 11/29/19 10:52 AM  Result Value Ref Range   WBC Count 4.1 4.0 - 10.5 K/uL   RBC 3.87 (L) 4.22 - 5.81 MIL/uL   Hemoglobin 12.3 (L) 13.0 - 17.0 g/dL   HCT 35.9 (L) 39 - 52 %   MCV 92.8 80.0 - 100.0 fL   MCH 31.8 26.0 - 34.0 pg   MCHC 34.3 30.0 - 36.0 g/dL   RDW 12.1 11.5 - 15.5 %   Platelet Count 117 (L) 150 - 400 K/uL   nRBC 0.0 0.0 - 0.2 %   Neutrophils Relative % 56 %   Neutro Abs 2.3 1.7 - 7.7 K/uL   Lymphocytes Relative 29 %   Lymphs Abs 1.2 0.7 - 4.0 K/uL   Monocytes Relative 12 %   Monocytes Absolute 0.5 0.1 - 1.0 K/uL   Eosinophils Relative 3 %   Eosinophils Absolute 0.1 0.0 - 0.5 K/uL   Basophils Relative 0 %   Basophils Absolute 0.0 0.0 - 0.1 K/uL   Immature Granulocytes 0 %   Abs Immature Granulocytes 0.01 0.00 - 0.07 K/uL    Comment: Performed at [facility], [address], [place], [place] [address]  Save Smear [facility])     Status: None   Collection Time: 11/29/19 10:52 AM  Result Value Ref Range   Smear Review SMEAR STAINED AND AVAILABLE FOR REVIEW     Comment: Performed at [facility] at [facility], [address] [place], [place] [address]      RADIOGRAPHY: No results found.     PATHOLOGY: None  ASSESSMENT/PLAN: Richard [name] is a very pleasant 69 yo gentleman with a several year history of intermittent pancytopenia.  His counts remain stable at this time and Richard [name] is asymptomatic.  Dr. Marin Olp was able to review his blood smear and no abnormalities or evidence of malignancy were noted. Richard [name] may have some mile myelodysplasia.  No bone marrow biopsy or abdominal US needed at this time. For now we will just continue to monitor his blood counts.  Follow-up in 4 months.   All  questions were answered and they are in agreement with the plan. Richard [name] can contact our office with any questions or concerns. We can certainly see him sooner if needed.   She was discussed with and also seen by Dr. Marin Olp and Richard [name] is in agreement with the aforementioned.   Laverna Peace, NP

## 2019-11-30 LAB — ERYTHROPOIETIN: Erythropoietin: 17.2 m[IU]/mL (ref 2.6–18.5)

## 2019-12-04 ENCOUNTER — Encounter: Payer: Self-pay | Admitting: *Deleted

## 2020-01-30 ENCOUNTER — Encounter: Payer: Self-pay | Admitting: Internal Medicine

## 2020-01-30 ENCOUNTER — Other Ambulatory Visit: Payer: Self-pay

## 2020-01-30 ENCOUNTER — Ambulatory Visit (INDEPENDENT_AMBULATORY_CARE_PROVIDER_SITE_OTHER): Payer: Medicare Other | Admitting: Internal Medicine

## 2020-01-30 DIAGNOSIS — S060XAA Concussion with loss of consciousness status unknown, initial encounter: Secondary | ICD-10-CM | POA: Insufficient documentation

## 2020-01-30 DIAGNOSIS — S060X0A Concussion without loss of consciousness, initial encounter: Secondary | ICD-10-CM

## 2020-01-30 DIAGNOSIS — D61818 Other pancytopenia: Secondary | ICD-10-CM

## 2020-01-30 DIAGNOSIS — S134XXA Sprain of ligaments of cervical spine, initial encounter: Secondary | ICD-10-CM | POA: Diagnosis not present

## 2020-01-30 MED ORDER — BUTALBITAL-APAP-CAFFEINE 50-325-40 MG PO TABS: 1.0000 | ORAL_TABLET | Freq: Two times a day (BID) | ORAL | 1 refills | Status: DC | PRN
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
## 2020-01-30 NOTE — Assessment & Plan Note (Addendum)
Richard [name] is reluctant to undergo tests (brain CT, C-spine CT) and have physical therapy done to the fears of contracting COVID-19 virus in the setting of pandemia. I prescribed Fioricet as needed. He will let me know if not better and if he is willing to consent to have the test done. Lions mane, vitamin B complex, rest  Let me know if worse-will schedule outpatient brain CT and cervical CT. Follow-up in 2 weeks.
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
## 2020-01-30 NOTE — Patient Instructions (Signed)
B-complex with Niacin 100 mg    Lion's mane  Concussion, Adult  A concussion is a brain injury from a hard, direct hit (trauma) to your head or body. This direct hit causes the brain to quickly shake back and forth inside the skull. A concussion may also be called a mild traumatic brain injury (TBI). Healing from this injury can take time. What are the causes? This condition is caused by:  A direct hit to your head, such as: ? Running into a player during a game. ? Being hit in a fight. ? Hitting your head on a hard surface.  A quick and sudden movement (jolt) of the head or neck, such as in a car crash. What are the signs or symptoms? The signs of a concussion can be hard to notice. They may be missed by you, family members, and doctors. You may look fine on the outside but may not act or feel normal. Physical symptoms  Headaches.  Being tired (fatigued).  Being dizzy.  Problems with body balance.  Problems seeing or hearing.  Being sensitive to light or noise.  Feeling sick to your stomach (nausea) or throwing up (vomiting).  Not sleeping or eating as you used to.  Loss of feeling (numbness) or tingling in the body.  Seizure. Mental and emotional symptoms  Problems remembering things.  Trouble focusing your mind (concentrating), organizing, or making decisions.  Being slow to think, act, react, speak, or read.  Feeling grouchy (irritable).  Having mood changes.  Feeling worried or nervous (anxious).  Feeling sad (depressed). How is this treated? This condition may be treated by:  Stopping sports or activity if you are injured. If you hit your head or have signs of concussion: ? Do not return to sports or activities the same day. ? Get checked by a doctor before you return to your activities.  Resting your body and your mind.  Being watched carefully, often at home.  Medicines to help with symptoms such as: ? Feeling sick to your  stomach. ? Headaches. ? Problems with sleep.  Avoid taking strong pain medicines (opioids) for a concussion.  Avoiding alcohol and drugs.  Being asked to go to a concussion clinic or a place to help you recover (rehabilitation center). Recovery from a concussion can take time. Return to activities only:  When you are fully healed.  When your doctor says it is safe. Follow these instructions at home: Activity  Limit activities that need a lot of thought or focus, such as: ? Homework or work for your job. ? Watching TV. ? Using the computer or phone. ? Playing memory games and puzzles.  Rest. Rest helps your brain heal. Make sure you: ? Get plenty of sleep. Most adults should get 7-9 hours of sleep each night. ? Rest during the day. Take naps or breaks when you feel tired.  Avoid activity like exercise until your doctor says its safe. Stop any activity that makes symptoms worse.  Do not do activities that could cause a second concussion, such as riding a bike or playing sports.  Ask your doctor when you can return to your normal activities, such as school, work, sports, and driving. Your ability to react may be slower. Do not do these activities if you are dizzy. General instructions   Take over-the-counter and prescription medicines only as told by your doctor.  Do not drink alcohol until your doctor says you can.  Watch your symptoms and tell other people  to do the same. Other problems can occur after a concussion. Older adults have a higher risk of serious problems.  Tell your work Freight forwarder, teachers, Government social research officer, school counselor, coach, or Product/process development scientist about your injury and symptoms. Tell them about what you can or cannot do.  Keep all follow-up visits as told by your doctor. This is important. How is this prevented?  It is very important that you do not get another brain injury. In rare cases, another injury can cause brain damage that will not go away, brain  swelling, or death. The risk of this is greatest in the first 7-10 days after a head injury. To avoid injuries: ? Stop activities that could lead to a second concussion, such as contact sports, until your doctor says it is okay. ? When you return to sports or activities:  Do not crash into other players. This is how most concussions happen.  Follow the rules.  Respect other players. ? Get regular exercise. Do strength and balance training. ? Wear a helmet that fits you well during sports, biking, or other activities.  Helmets can help protect you from serious skull and brain injuries, but they do not protect you from a concussion. Even when wearing a helmet, you should avoid being hit in the head. Contact a doctor if:  Your symptoms get worse or they do not get better.  You have new symptoms.  You have another injury. Get help right away if:  You have bad headaches or your headaches get worse.  You feel weak or numb in any part of your body.  You are mixed up (confused).  Your balance gets worse.  You keep throwing up.  You feel more sleepy than normal.  Your speech is not clear (is slurred).  You cannot recognize people or places.  You have a seizure.  Others have trouble waking you up.  You have behavior changes.  You have changes in how you see (vision).  You pass out (lose consciousness). Summary  A concussion is a brain injury from a hard, direct hit (trauma) to your head or body.  This condition is treated with rest and careful watching of symptoms.  If you keep having symptoms, call your doctor. This information is not intended to replace advice given to you by your health care provider. Make sure you discuss any questions you have with your health care provider. Document Revised: 09/23/2017 Document Reviewed: 09/23/2017 Elsevier Patient Education  [place].

## 2020-02-21 ENCOUNTER — Encounter: Payer: Self-pay | Admitting: Internal Medicine

## 2020-02-21 ENCOUNTER — Other Ambulatory Visit: Payer: Self-pay

## 2020-02-21 ENCOUNTER — Ambulatory Visit (INDEPENDENT_AMBULATORY_CARE_PROVIDER_SITE_OTHER): Payer: Medicare Other | Admitting: Internal Medicine

## 2020-02-21 DIAGNOSIS — S060X0A Concussion without loss of consciousness, initial encounter: Secondary | ICD-10-CM | POA: Diagnosis not present

## 2020-02-21 DIAGNOSIS — D61818 Other pancytopenia: Secondary | ICD-10-CM | POA: Diagnosis not present

## 2020-02-21 DIAGNOSIS — S134XXA Sprain of ligaments of cervical spine, initial encounter: Secondary | ICD-10-CM | POA: Diagnosis not present

## 2020-02-21 NOTE — Assessment & Plan Note (Addendum)
I prescribed Fioricet as needed before, [name] used Ibuprofen.  Mental focus is a little better

## 2020-02-21 NOTE — Assessment & Plan Note (Addendum)
Pain is more on the L L hand felt funny for a few days, however, it has resolved. We can start physical therapy when Covid 19 surges down

## 2020-02-21 NOTE — Assessment & Plan Note (Addendum)
F/u w/Dr Myna Hidalgo.  Last CBC reviewed.
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
## 2020-02-21 NOTE — Assessment & Plan Note (Signed)
On Lion's mane Overall better

## 2020-04-03 ENCOUNTER — Encounter: Payer: Self-pay | Admitting: Hematology & Oncology

## 2020-04-03 ENCOUNTER — Inpatient Hospital Stay: Payer: Medicare Other | Attending: Hematology & Oncology | Admitting: Hematology & Oncology

## 2020-04-03 ENCOUNTER — Inpatient Hospital Stay: Payer: Medicare Other

## 2020-04-03 ENCOUNTER — Other Ambulatory Visit: Payer: Self-pay

## 2020-04-03 VITALS — BP 129/69 | HR 72 | Temp 98.3°F | Resp 17 | Wt 218.0 lb

## 2020-04-03 DIAGNOSIS — Z886 Allergy status to analgesic agent status: Secondary | ICD-10-CM | POA: Insufficient documentation

## 2020-04-03 DIAGNOSIS — D61818 Other pancytopenia: Secondary | ICD-10-CM | POA: Diagnosis not present

## 2020-04-03 DIAGNOSIS — Z7982 Long term (current) use of aspirin: Secondary | ICD-10-CM | POA: Insufficient documentation

## 2020-04-03 DIAGNOSIS — D469 Myelodysplastic syndrome, unspecified: Secondary | ICD-10-CM | POA: Insufficient documentation

## 2020-04-03 DIAGNOSIS — Z79899 Other long term (current) drug therapy: Secondary | ICD-10-CM | POA: Diagnosis not present

## 2020-04-03 LAB — CBC WITH DIFFERENTIAL (CANCER CENTER ONLY)
Abs Immature Granulocytes: 0.01 10*3/uL (ref 0.00–0.07)
Basophils Absolute: 0 10*3/uL (ref 0.0–0.1)
Basophils Relative: 0 %
Eosinophils Absolute: 0.1 10*3/uL (ref 0.0–0.5)
Eosinophils Relative: 2 %
HCT: 35.1 % — ABNORMAL LOW (ref 39.0–52.0)
Hemoglobin: 12.6 g/dL — ABNORMAL LOW (ref 13.0–17.0)
Immature Granulocytes: 0 %
Lymphocytes Relative: 27 %
Lymphs Abs: 1.2 10*3/uL (ref 0.7–4.0)
MCH: 32.9 pg (ref 26.0–34.0)
MCHC: 35.9 g/dL (ref 30.0–36.0)
MCV: 91.6 fL (ref 80.0–100.0)
Monocytes Absolute: 0.5 10*3/uL (ref 0.1–1.0)
Monocytes Relative: 11 %
Neutro Abs: 2.7 10*3/uL (ref 1.7–7.7)
Neutrophils Relative %: 60 %
Platelet Count: 126 10*3/uL — ABNORMAL LOW (ref 150–400)
RBC: 3.83 MIL/uL — ABNORMAL LOW (ref 4.22–5.81)
RDW: 12.1 % (ref 11.5–15.5)
WBC Count: 4.5 10*3/uL (ref 4.0–10.5)
nRBC: 0 % (ref 0.0–0.2)

## 2020-04-03 LAB — CMP (CANCER CENTER ONLY)
ALT: 25 U/L (ref 0–44)
AST: 18 U/L (ref 15–41)
Albumin: 4.5 g/dL (ref 3.5–5.0)
Alkaline Phosphatase: 58 U/L (ref 38–126)
Anion gap: 6 (ref 5–15)
BUN: 28 mg/dL — ABNORMAL HIGH (ref 8–23)
CO2: 29 mmol/L (ref 22–32)
Calcium: 9.7 mg/dL (ref 8.9–10.3)
Chloride: 103 mmol/L (ref 98–111)
Creatinine: 1.22 mg/dL (ref 0.61–1.24)
GFR, Estimated: >60 mL/min (ref >60)
Glucose, Bld: 100 mg/dL — ABNORMAL HIGH (ref 70–99)
Potassium: 4.4 mmol/L (ref 3.5–5.1)
Sodium: 138 mmol/L (ref 135–145)
Total Bilirubin: 0.7 mg/dL (ref 0.3–1.2)
Total Protein: 7 g/dL (ref 6.5–8.1)

## 2020-04-03 LAB — LACTATE DEHYDROGENASE: LDH: 161 U/L (ref 98–192)

## 2020-04-03 LAB — PLATELET BY CITRATE

## 2020-04-03 LAB — SAVE SMEAR(SSMR), FOR PROVIDER SLIDE REVIEW

## 2020-04-03 NOTE — Progress Notes (Signed)
Hematology and Oncology Follow Up Visit  LIBERATO STANSBERY [id number] 06/06/1950 70 y.o. 04/03/2020   Principle Diagnosis:   Pancytopenia -- mild  Current Therapy:    Observation     Interim History:  Mr. Judice is back for follow-up.  He was first seen in October.  At that time, he was noted to have mild pancytopenia.  He has had this for quite a while.  He has been totally asymptomatic.  There is been no problems with infections.  He has had no weight loss or weight gain.  He is not a vegetarian.  He does not smoke.  He rarely has alcohol.  There is been no change in bowel or bladder habits.  He has had no obvious occupational exposures and the past.  He is originally from [place].  He has been down in [place] for about 50 years.  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  He has had no leg swelling.  He has had no rashes.  Has not noted any swollen lymph nodes.  Is been no issues with cough or shortness of breath.  He has had no problems with Covid.  He has had the vaccines.  Overall, his performance status is ECOG 0.    Medications:  Current Outpatient Medications:  .  aspirin EC 81 MG tablet, Take 1 tablet (81 mg total) by mouth daily., Disp: 100 tablet, Rfl: 3 .  b complex vitamins capsule, Take 1 capsule by mouth daily., Disp: , Rfl:  .  butalbital-acetaminophen-caffeine (FIORICET) 50-325-40 MG tablet, Take 1 tablet by mouth 2 (two) times daily as needed for headache., Disp: 20 tablet, Rfl: 1 .  Cholecalciferol (VITAMIN D) 2000 UNITS CAPS, Take by mouth daily., Disp: , Rfl:  .  doxazosin (CARDURA) 4 MG tablet, TAKE 1 TABLET BY MOUTH DAILY. GENERIC EQUIVALENT FOR CARDURA, Disp: 90 tablet, Rfl: 3 .  Flaxseed, Linseed, (FLAXSEED OIL PO), Take by mouth. Takes 1400 mg daily, Disp: , Rfl:  .  Multiple Vitamin (MULTIVITAMIN) tablet, Take 1 tablet by mouth daily., Disp: , Rfl:  .  OVER THE COUNTER MEDICATION, Lion's Mane (OTC) take 1 by mouth daily, Disp: , Rfl:  .   rosuvastatin (CRESTOR) 5 MG tablet, Take 1 tablet (5 mg total) by mouth daily., Disp: 90 tablet, Rfl: 3 .  Thiamine HCl (VITAMIN B-1 PO), Take by mouth daily., Disp: , Rfl:  .  triamcinolone cream (KENALOG) 0.1 %, Apply 1 application topically 2 (two) times daily., Disp: 45 g, Rfl: 0 .  Turmeric (QC TUMERIC COMPLEX PO), Take by mouth., Disp: , Rfl:  .  vitamin C (ASCORBIC ACID) 500 MG tablet, Take 500 mg by mouth daily., Disp: , Rfl:   Allergies:  Allergies  Allergen Reactions  . Aspirin Rash    Past Medical History, Surgical history, Social history, and Family History were reviewed and updated.  Review of Systems: Review of Systems  Constitutional: Negative.   HENT:  Negative.   Eyes: Negative.   Respiratory: Negative.   Cardiovascular: Negative.   Gastrointestinal: Negative.   Endocrine: Negative.   Genitourinary: Negative.    Musculoskeletal: Negative.   Skin: Negative.   Neurological: Negative.   Hematological: Negative.   Psychiatric/Behavioral: Negative.     Physical Exam:  weight is 218 lb (98.9 kg). His oral temperature is 98.3 F (36.8 C). His blood pressure is 129/69 and his pulse is 72. His respiration is 17 and oxygen saturation is 97%.   Wt Readings from Last 3 Encounters:  04/03/20 218 lb (98.9 kg)  02/21/20 211 lb 6.4 oz (95.9 kg)  01/30/20 209 lb 9.6 oz (95.1 kg)    Physical Exam Vitals reviewed.  HENT:     Head: Normocephalic and atraumatic.     Mouth/Throat:     Mouth: Oropharynx is clear and moist.  Eyes:     Extraocular Movements: EOM normal.     Pupils: Pupils are equal, round, and reactive to light.  Cardiovascular:     Rate and Rhythm: Normal rate and regular rhythm.     Heart sounds: Normal heart sounds.  Pulmonary:     Effort: Pulmonary effort is normal.     Breath sounds: Normal breath sounds.  Abdominal:     General: Bowel sounds are normal.     Palpations: Abdomen is soft.  Musculoskeletal:        General: No tenderness,  deformity or edema. Normal range of motion.     Cervical back: Normal range of motion.  Lymphadenopathy:     Cervical: No cervical adenopathy.  Skin:    General: Skin is warm and dry.     Findings: No erythema or rash.  Neurological:     Mental Status: He is alert and oriented to person, place, and time.  Psychiatric:        Mood and Affect: Mood and affect normal.        Behavior: Behavior normal.        Thought Content: Thought content normal.        Judgment: Judgment normal.      Lab Results  Component Value Date   WBC 4.5 04/03/2020   HGB 12.6 (L) 04/03/2020   HCT 35.1 (L) 04/03/2020   MCV 91.6 04/03/2020   PLT 126 (L) 04/03/2020     Chemistry      Component Value Date/Time   NA 138 04/03/2020 1137   K 4.4 04/03/2020 1137   CL 103 04/03/2020 1137   CO2 29 04/03/2020 1137   BUN 28 (H) 04/03/2020 1137   CREATININE 1.22 04/03/2020 1137   CREATININE 1.13 10/30/2019 0850      Component Value Date/Time   CALCIUM 9.7 04/03/2020 1137   ALKPHOS 58 04/03/2020 1137   AST 18 04/03/2020 1137   ALT 25 04/03/2020 1137   BILITOT 0.7 04/03/2020 1137      Impression and Plan: Mr. Egelston is a very nice 69 year old white male.  Here is really from [place].  He has a really wonderful last name.  I did look at his blood smear under the microscope.  I do not see any immature myeloid cells.  He has no nucleated red blood cells.  There are no hypersegmented polys.  Platelets are adequate in number and size.  Again, I cannot find anything on his exam or on the blood smear that is worrisome.  I suppose that he could always have myelodysplasia.  However, only way we would know this is to do a bone marrow biopsy on him which does not have to be done.  For now, I would just follow this along.  He is totally asymptomatic.  His blood counts have not worsened.  We will plan to get him back in another 6 months.  I will see how erythema looks at that time.  It is very nice talking to he  and his wife.  They have been married almost 101 years.   Volanda Napoleon, MD 2/16/20221:27 PM

## 2020-04-04 ENCOUNTER — Encounter: Payer: Self-pay | Admitting: Internal Medicine

## 2020-04-04 ENCOUNTER — Other Ambulatory Visit: Payer: Self-pay

## 2020-04-04 ENCOUNTER — Ambulatory Visit (INDEPENDENT_AMBULATORY_CARE_PROVIDER_SITE_OTHER): Payer: Medicare Other | Admitting: Internal Medicine

## 2020-04-04 DIAGNOSIS — M5481 Occipital neuralgia: Secondary | ICD-10-CM | POA: Insufficient documentation

## 2020-04-04 DIAGNOSIS — S134XXS Sprain of ligaments of cervical spine, sequela: Secondary | ICD-10-CM | POA: Diagnosis not present

## 2020-04-04 NOTE — Assessment & Plan Note (Signed)
[facility] w/ L occipital neuralgia pain

## 2020-04-04 NOTE — Patient Instructions (Signed)
Occipital Neuralgia  Occipital neuralgia is a type of headache that causes brief episodes of very bad pain in the back of the head. Pain from occipital neuralgia may spread (radiate) to other parts of the head. These headaches may be caused by irritation of the nerves that leave the spinal cord high up in the neck, just below the base of the skull (occipital nerves). The occipital nerves transmit sensations from the back of the head, the top of the head, and the areas behind the ears. What are the causes? This condition can occur without any known cause (primary headache syndrome). In other cases, this condition is caused by pressure on or irritation of one of the two occipital nerves. Pressure and irritation may be due to:  Muscle spasm in the neck.  Neck injury.  Wear and tear of the vertebrae in the neck (osteoarthritis).  Disease of the disks that separate the vertebrae.  Swollen blood vessels that put pressure on the occipital nerves.  Infections.  Tumors.  Diabetes. What are the signs or symptoms? This condition causes brief burning, stabbing, electric, shocking, or shooting pain in the back of the head that can radiate to the top of the head. It can happen on one side or both sides of the head. It can also cause:  Pain behind the eye.  Pain triggered by neck movement or hair brushing.  Scalp tenderness.  Aching in the back of the head between episodes of very bad pain.  Pain that gets worse with exposure to bright lights. How is this diagnosed? Your health care provider may diagnose the condition based on a physical exam and your symptoms. Tests may be done, such as:  Imaging studies of the brain and neck (cervical spine), such as an MRI or CT scan. These look for causes of pinched nerves.  Applying pressure to the nerves in the neck to try to re-create the pain.  Injection of numbing medicine into the occipital nerve areas to see if pain goes away (diagnostic nerve  block).   How is this treated? Treatment for this condition may begin with simple measures, such as:  Rest.  Massage.  Applying heat or cold to the area.  Over-the-counter pain relievers. If these measures do not work, you may need other treatments, including:  Medicines, such as: ? Prescription-strength anti-inflammatory medicines. ? Muscle relaxants. ? Anti-seizure medicines, which can relieve pain. ? Antidepressants, which can relieve pain. ? Injected medicines, such as medicines that numb the area (local anesthetic) and steroids.  Pulsed radiofrequency ablation. This is when wires are implanted to deliver electrical impulses that block pain signals from the occipital nerve.  Surgery to relieve nerve pressure.  Physical therapy. Follow these instructions at home: Managing pain  Avoid any activities that cause pain.  Rest when you have an attack of pain.  Try gentle massage to relieve pain.  Try a different pillow or sleeping position.  If directed, apply heat to the affected area as often as told by your health care provider. Use the heat source that your health care provider recommends, such as a moist heat pack or a heating pad. ? Place a towel between your skin and the heat source. ? Leave the heat on for 20-30 minutes. ? Remove the heat if your skin turns bright red. This is especially important if you are unable to feel pain, heat, or cold. You have a greater risk of getting burned.  If directed, put ice on the back of your  head and neck area. To do this: ? Put ice in a plastic bag. ? Place a towel between your skin and the bag. ? Leave the ice on for 20 minutes, 2-3 times a day. ? Remove the ice if your skin turns bright red. This is very important. If you cannot feel pain, heat, or cold, you have a greater risk of damage to the area.      General instructions  Take over-the-counter and prescription medicines only as told by your health care  provider.  Avoid things that make your symptoms worse, such as bright lights.  Try to stay active. Get regular exercise that does not cause pain. Ask your health care provider to suggest safe exercises for you.  Work with a physical therapist to learn stretching exercises you can do at home.  Practice good posture.  Keep all follow-up visits. This is important. Contact a health care provider if:  Your medicine is not working.  You have new or worsening symptoms. Get help right away if:  You have very bad head pain that does not go away.  You have a sudden change in vision, balance, or speech. These symptoms may represent a serious problem that is an emergency. Do not wait to see if the symptoms will go away. Get medical help right away. Call your local emergency services (911 in the [place] Do not drive yourself to the hospital. Summary  Occipital neuralgia is a type of headache that causes brief episodes of very bad pain in the back of the head.  Pain from occipital neuralgia may spread (radiate) to other parts of the head.  Treatment for this condition includes rest, massage, and medicines. This information is not intended to replace advice given to you by your health care provider. Make sure you discuss any questions you have with your health care provider. Document Revised: 12/03/2019 Document Reviewed: 12/03/2019 Elsevier Patient Education  2021 [employer].
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
## 2020-04-19 ENCOUNTER — Other Ambulatory Visit: Payer: Self-pay

## 2020-04-19 ENCOUNTER — Ambulatory Visit (INDEPENDENT_AMBULATORY_CARE_PROVIDER_SITE_OTHER): Payer: Medicare Other

## 2020-04-19 VITALS — BP 120/70 | HR 69 | Temp 98.4°F | Ht 72.0 in | Wt 216.4 lb

## 2020-04-19 DIAGNOSIS — Z0000 Encounter for general adult medical examination without abnormal findings: Secondary | ICD-10-CM

## 2020-04-19 NOTE — Patient Instructions (Signed)
Richard [name] , Thank you for taking time to come for your Medicare Wellness Visit. I appreciate your ongoing commitment to your health goals. Please review the following plan we discussed and let me know if I can assist you in the future.   Screening recommendations/referrals: Colonoscopy: 11/17/2017; due every 5 years (due 11/2022) Recommended yearly ophthalmology/optometry visit for glaucoma screening and checkup Recommended yearly dental visit for hygiene and checkup  Vaccinations: Influenza vaccine: 10/27/2019 Pneumococcal vaccine: 04/16/2011, 08/06/2015 Tdap vaccine: 04/16/2011; due every 10 years Shingles vaccine: never done   Covid-19: 03/09/2019, 2/11/20201, 11/21/2019  Advanced directives: Please bring a copy of your health care power of attorney and living will to the office at your convenience.  Conditions/risks identified: Yes; Reviewed health maintenance screenings with patient today and relevant education, vaccines, and/or referrals were provided. Please continue to do your personal lifestyle choices by: daily care of teeth and gums, regular physical activity (goal should be 5 days a week for 30 minutes), eat a healthy diet, avoid tobacco and drug use, limiting any alcohol intake, taking a low-dose aspirin (if not allergic or have been advised by your provider otherwise) and taking vitamins and minerals as recommended by your provider. Continue doing brain stimulating activities (puzzles, reading, adult coloring books, staying active) to keep memory sharp. Continue to eat heart healthy diet (full of fruits, vegetables, whole grains, lean protein, water--limit salt, fat, and sugar intake) and increase physical activity as tolerated.  Next appointment: Please schedule your next Medicare Wellness Visit with your Nurse Health Advisor in 1 year by calling [phone].  Preventive Care 37 Years and Older, Male Preventive care refers to lifestyle choices and visits with your health care  provider that can promote health and wellness. What does preventive care include?  A yearly physical exam. This is also called an annual well check.  Dental exams once or twice a year.  Routine eye exams. Ask your health care provider how often you should have your eyes checked.  Personal lifestyle choices, including:  Daily care of your teeth and gums.  Regular physical activity.  Eating a healthy diet.  Avoiding tobacco and drug use.  Limiting alcohol use.  Practicing safe sex.  Taking low doses of aspirin every day.  Taking vitamin and mineral supplements as recommended by your health care provider. What happens during an annual well check? The services and screenings done by your health care provider during your annual well check will depend on your age, overall health, lifestyle risk factors, and family history of disease. Counseling  Your health care provider may ask you questions about your:  Alcohol use.  Tobacco use.  Drug use.  Emotional well-being.  Home and relationship well-being.  Sexual activity.  Eating habits.  History of falls.  Memory and ability to understand (cognition).  Work and work Statistician. Screening  You may have the following tests or measurements:  Height, weight, and BMI.  Blood pressure.  Lipid and cholesterol levels. These may be checked every 5 years, or more frequently if you are over 25 years old.  Skin check.  Lung cancer screening. You may have this screening every year starting at age 57 if you have a 30-pack-year history of smoking and currently smoke or have quit within the past 15 years.  Fecal occult blood test (FOBT) of the stool. You may have this test every year starting at age 65.  Flexible sigmoidoscopy or colonoscopy. You may have a sigmoidoscopy every 5 years or a colonoscopy every 10  years starting at age 21.  Prostate cancer screening. Recommendations will vary depending on your family history and  other risks.  Hepatitis C blood test.  Hepatitis B blood test.  Sexually transmitted disease (STD) testing.  Diabetes screening. This is done by checking your blood sugar (glucose) after you have not eaten for a while (fasting). You may have this done every 1-3 years.  Abdominal aortic aneurysm (AAA) screening. You may need this if you are a current or former smoker.  Osteoporosis. You may be screened starting at age 47 if you are at high risk. Talk with your health care provider about your test results, treatment options, and if necessary, the need for more tests. Vaccines  Your health care provider may recommend certain vaccines, such as:  Influenza vaccine. This is recommended every year.  Tetanus, diphtheria, and acellular pertussis (Tdap, Td) vaccine. You may need a Td booster every 10 years.  Zoster vaccine. You may need this after age 18.  Pneumococcal 13-valent conjugate (PCV13) vaccine. One dose is recommended after age 40.  Pneumococcal polysaccharide (PPSV23) vaccine. One dose is recommended after age 48. Talk to your health care provider about which screenings and vaccines you need and how often you need them. This information is not intended to replace advice given to you by your health care provider. Make sure you discuss any questions you have with your health care provider. Document Released: 03/01/2015 Document Revised: 10/23/2015 Document Reviewed: 12/04/2014 Elsevier Interactive Patient Education  2017 [place] Prevention in the Home Falls can cause injuries. They can happen to people of all ages. There are many things you can do to make your home safe and to help prevent falls. What can I do on the outside of my home?  Regularly fix the edges of walkways and driveways and fix any cracks.  Remove anything that might make you trip as you walk through a door, such as a raised step or threshold.  Trim any bushes or trees on the path to your  home.  Use bright outdoor lighting.  Clear any walking paths of anything that might make someone trip, such as rocks or tools.  Regularly check to see if handrails are loose or broken. Make sure that both sides of any steps have handrails.  Any raised decks and porches should have guardrails on the edges.  Have any leaves, snow, or ice cleared regularly.  Use sand or salt on walking paths during winter.  Clean up any spills in your garage right away. This includes oil or grease spills. What can I do in the bathroom?  Use night lights.  Install grab bars by the toilet and in the tub and shower. Do not use towel bars as grab bars.  Use non-skid mats or decals in the tub or shower.  If you need to sit down in the shower, use a plastic, non-slip stool.  Keep the floor dry. Clean up any water that spills on the floor as soon as it happens.  Remove soap buildup in the tub or shower regularly.  Attach bath mats securely with double-sided non-slip rug tape.  Do not have throw rugs and other things on the floor that can make you trip. What can I do in the bedroom?  Use night lights.  Make sure that you have a light by your bed that is easy to reach.  Do not use any sheets or blankets that are too big for your bed. They should not hang  down onto the floor.  Have a firm chair that has side arms. You can use this for support while you get dressed.  Do not have throw rugs and other things on the floor that can make you trip. What can I do in the kitchen?  Clean up any spills right away.  Avoid walking on wet floors.  Keep items that you use a lot in easy-to-reach places.  If you need to reach something above you, use a strong step stool that has a grab bar.  Keep electrical cords out of the way.  Do not use floor polish or wax that makes floors slippery. If you must use wax, use non-skid floor wax.  Do not have throw rugs and other things on the floor that can make you  trip. What can I do with my stairs?  Do not leave any items on the stairs.  Make sure that there are handrails on both sides of the stairs and use them. Fix handrails that are broken or loose. Make sure that handrails are as long as the stairways.  Check any carpeting to make sure that it is firmly attached to the stairs. Fix any carpet that is loose or worn.  Avoid having throw rugs at the top or bottom of the stairs. If you do have throw rugs, attach them to the floor with carpet tape.  Make sure that you have a light switch at the top of the stairs and the bottom of the stairs. If you do not have them, ask someone to add them for you. What else can I do to help prevent falls?  Wear shoes that:  Do not have high heels.  Have rubber bottoms.  Are comfortable and fit you well.  Are closed at the toe. Do not wear sandals.  If you use a stepladder:  Make sure that it is fully opened. Do not climb a closed stepladder.  Make sure that both sides of the stepladder are locked into place.  Ask someone to hold it for you, if possible.  Clearly mark and make sure that you can see:  Any grab bars or handrails.  First and last steps.  Where the edge of each step is.  Use tools that help you move around (mobility aids) if they are needed. These include:  Canes.  Walkers.  Scooters.  Crutches.  Turn on the lights when you go into a dark area. Replace any light bulbs as soon as they burn out.  Set up your furniture so you have a clear path. Avoid moving your furniture around.  If any of your floors are uneven, fix them.  If there are any pets around you, be aware of where they are.  Review your medicines with your doctor. Some medicines can make you feel dizzy. This can increase your chance of falling. Ask your doctor what other things that you can do to help prevent falls. This information is not intended to replace advice given to you by your health care provider. Make  sure you discuss any questions you have with your health care provider. Document Released: 11/29/2008 Document Revised: 07/11/2015 Document Reviewed: 03/09/2014 Elsevier Interactive Patient Education  2017 [employer].

## 2020-04-19 NOTE — Progress Notes (Signed)
Subjective:   Richard [name] is a 70 y.o. male who presents for Medicare Annual/Subsequent preventive examination.  Review of Systems    No ROS. Medicare Wellness Virtual Visit. Additional risk factors are reflected in social history. Cardiac Risk Factors include: advanced age (>57men, >32 women);dyslipidemia;male gender     Objective:    Today's Vitals   04/19/20 0813  BP: 120/70  Pulse: 69  Temp: 98.4 F (36.9 C)  SpO2: 95%  Weight: 216 lb 6.4 oz (98.2 kg)  Height: 6' (1.829 m)  PainSc: 0-No pain   Body mass index is 29.35 kg/m.  Advanced Directives 04/19/2020 11/29/2019 02/14/2016  Does Patient Have a Medical Advance Directive? Yes No No  Does patient want to make changes to medical advance directive? No - Patient declined - -  Would patient like information on creating a medical advance directive? - No - Patient declined -    Current Medications (verified) Outpatient Encounter Medications as of 04/19/2020  Medication Sig  . aspirin EC 81 MG tablet Take 1 tablet (81 mg total) by mouth daily.  Marland Kitchen b complex vitamins capsule Take 1 capsule by mouth daily.  . Cholecalciferol (VITAMIN D) 2000 UNITS CAPS Take by mouth daily.  Marland Kitchen doxazosin (CARDURA) 4 MG tablet TAKE 1 TABLET BY MOUTH DAILY. GENERIC EQUIVALENT FOR CARDURA  . Flaxseed, Linseed, (FLAXSEED OIL PO) Take by mouth. Takes 1400 mg daily  . Multiple Vitamin (MULTIVITAMIN) tablet Take 1 tablet by mouth daily.  Marland Kitchen OVER THE COUNTER MEDICATION [place] (OTC) take 1 by mouth daily  . rosuvastatin (CRESTOR) 5 MG tablet Take 1 tablet (5 mg total) by mouth daily.  . Thiamine HCl (VITAMIN B-1 PO) Take by mouth daily.  Marland Kitchen triamcinolone cream (KENALOG) 0.1 % Apply 1 application topically 2 (two) times daily.  . Turmeric (QC TUMERIC COMPLEX PO) Take by mouth.  . vitamin C (ASCORBIC ACID) 500 MG tablet Take 500 mg by mouth daily.   No facility-administered encounter medications on file as of 04/19/2020.    Allergies  (verified) Aspirin   History: Past Medical History:  Diagnosis Date  . BPH (benign prostatic hyperplasia)   . Hypertension   . Hypogonadism male   . Mononucleosis   . Mumps   . Plantar fasciitis   . PONV (postoperative nausea and vomiting)   . Zoster    Past Surgical History:  Procedure Laterality Date  . circumsicion  2007   asa adult for balanitis w/ phimosis  . COLONOSCOPY  2013  . HERNIA REPAIR Left   . INGUINAL HERNIA REPAIR  childhood   left  . LIPOMA EXCISION N/A 02/19/2016   Procedure: EXCISION SUBCUTANEOUS LIPOMA BACK;  Surgeon: Donnie Mesa, MD;  Location: [place];  Service: General;  Laterality: N/A;  . MOUTH SURGERY     early eruption of incisors requiring surgical extraction  . POLYPECTOMY    . TONSILLECTOMY  1959  . WISDOM TOOTH EXTRACTION  1990   Family History  Problem Relation Age of Onset  . Arthritis Mother        spinal stenosis s/p surgery  . Diabetes Neg Hx   . Heart disease Neg Hx   . Colon cancer Neg Hx   . Stomach cancer Neg Hx   . Colon polyps Neg Hx   . Rectal cancer Neg Hx    Social History   Socioeconomic History  . Marital status: Married    Spouse name: Not on file  . Number of children:  3  . Years of education: 70  . Highest education level: Not on file  Occupational History  . Occupation: Press photographer  Tobacco Use  . Smoking status: Former Smoker    Quit date: 04/16/1967    Years since quitting: 53.0  . Smokeless tobacco: Never Used  Vaping Use  . Vaping Use: Never used  Substance and Sexual Activity  . Alcohol use: No  . Drug use: No  . Sexual activity: Yes    Partners: Female  Other Topics Concern  . Not on file  Social History Narrative   HSG, [place]. Married '75. 2 sons - '71(adopted), '76; 1 dtr (adopted) '73; 4 grandchildren.   Work - Actuary, currently Garment/textile technologist. Marriage is in good health. ACP - HCPOA - wife and son; yes - CPR; yes for acute care; no prolonged futile or  heroic measures.      JEHOVAH'S WITNESS - OK for cell salvage, volume expanded, immunoglobulins. Solid organ transplant - ????   Social Determinants of Health   Financial Resource Strain: Low Risk   . Difficulty of Paying Living Expenses: Not hard at all  Food Insecurity: No Food Insecurity  . Worried About Charity fundraiser in the Last Year: Never true  . Ran Out of Food in the Last Year: Never true  Transportation Needs: No Transportation Needs  . Lack of Transportation (Medical): No  . Lack of Transportation (Non-Medical): No  Physical Activity: Sufficiently Active  . Days of Exercise per Week: 5 days  . Minutes of Exercise per Session: 30 min  Stress: No Stress Concern Present  . Feeling of Stress : Not at all  Social Connections: Socially Integrated  . Frequency of Communication with Friends and Family: More than three times a week  . Frequency of Social Gatherings with Friends and Family: More than three times a week  . Attends Religious Services: More than 4 times per year  . Active Member of Clubs or Organizations: Yes  . Attends Archivist Meetings: More than 4 times per year  . Marital Status: Married    Tobacco Counseling Counseling given: Not Answered   Clinical Intake:  Pre-visit preparation completed: Yes  Pain : No/denies pain Pain Score: 0-No pain     BMI - recorded: 29.35 Nutritional Status: BMI 25 -29 Overweight Nutritional Risks: None Diabetes: No  How often do you need to have someone help you when you read instructions, pamphlets, or other written materials from your doctor or pharmacy?: 1 - Never What is the last grade level you completed in school?: Bachelor's Degree  Diabetic? no  Interpreter Needed?: No  Information entered by :: Lisette Abu, LPN   Activities of Daily Living In your present state of health, do you have any difficulty performing the following activities: 04/19/2020  Hearing? N  Vision? N  Difficulty  concentrating or making decisions? N  Walking or climbing stairs? N  Dressing or bathing? N  Doing errands, shopping? N  Preparing Food and eating ? N  Using the Toilet? N  In the past six months, have you accidently leaked urine? N  Do you have problems with loss of bowel control? N  Managing your Medications? N  Managing your Finances? N  Housekeeping or managing your Housekeeping? N  Some recent data might be hidden    Patient Care Team: Plotnikov, Evie Lacks, MD as PCP - General (Internal Medicine)  Indicate any recent Medical Services you may have received from  other than [facility] providers in the past year (date may be approximate).     Assessment:   This is a routine wellness examination for [place].  Hearing/Vision screen No exam data present  Dietary issues and exercise activities discussed: Current Exercise Habits: Home exercise routine, Type of exercise: walking, Time (Minutes): 30, Frequency (Times/Week): 5, Weekly Exercise (Minutes/Week): 150, Exercise limited by: None identified  Goals    . Patient Stated     To maintain my current health status by continuing to eat healthy, stay physically active and socially active.      Depression Screen PHQ 2/9 Scores 04/19/2020 10/30/2019 10/25/2017 10/23/2016 08/06/2015  PHQ - 2 Score 0 0 0 0 0    Fall Risk Fall Risk  04/04/2020 01/10/2019 10/25/2017 10/23/2016 08/06/2015  Falls in the past year? 1 0 No No No  Comment - Emmi Telephone Survey: data to providers prior to load - - -  Number falls in past yr: 0 - - - -  Injury with Fall? 0 - - - -  Risk for fall due to : No Fall Risks - - - -    FALL RISK PREVENTION PERTAINING TO THE HOME:  Any stairs in or around the home? No  If so, are there any without handrails? No  Home free of loose throw rugs in walkways, pet beds, electrical cords, etc? Yes  Adequate lighting in your home to reduce risk of falls? Yes   ASSISTIVE DEVICES UTILIZED TO PREVENT FALLS:  Life alert? No  Use of  a cane, walker or w/c? No  Grab bars in the bathroom? No  Shower chair or bench in shower? Yes  Elevated toilet seat or a handicapped toilet? Yes   TIMED UP AND GO:  Was the test performed? No .  Length of time to ambulate 10 feet: 0 sec.   Gait steady and fast without use of assistive device  Cognitive Function: Normal cognitive status assessed by direct observation by this Nurse Health Advisor. No abnormalities found.          Immunizations Immunization History  Administered Date(s) Administered  . Fluad Quad(high Dose 65+) 10/21/2018, 10/21/2018, 10/18/2019  . Influenza Whole 12/16/2007, 11/13/2008, 01/18/2012  . Influenza, High Dose Seasonal PF 10/18/2015, 10/25/2017  . Influenza,inj,Quad PF,6+ Mos 11/14/2014, 10/23/2016  . PFIZER(Purple Top)SARS-COV-2 Vaccination 03/09/2019, 03/30/2019, 11/21/2019  . Pneumococcal Conjugate-13 08/06/2015  . Pneumococcal Polysaccharide-23 04/16/2011  . Tdap 04/16/2011  . Zoster 09/26/2011    TDAP status: Up to date  Flu Vaccine status: Up to date  Pneumococcal vaccine status: Up to date  Covid-19 vaccine status: Completed vaccines  Qualifies for Shingles Vaccine? Yes   Zostavax completed Yes   Shingrix Completed?: No.    Education has been provided regarding the importance of this vaccine. Patient has been advised to call insurance company to determine out of pocket expense if they have not yet received this vaccine. Advised may also receive vaccine at local pharmacy or Health Dept. Verbalized acceptance and understanding.  Screening Tests Health Maintenance  Topic Date Due  . PNA vac Low Risk Adult (2 of 2 - PPSV23) 08/05/2016  . COVID-19 Vaccine (4 - Booster for Pfizer series) 05/21/2020  . TETANUS/TDAP  04/16/2021  . COLONOSCOPY (Pts 45-23yrs Insurance coverage will need to be confirmed)  11/18/2022  . INFLUENZA VACCINE  Completed  . Hepatitis C Screening  Completed  . HPV VACCINES  Aged Out    Health  Maintenance  Health Maintenance Due  Topic Date Due  .  PNA vac Low Risk Adult (2 of 2 - PPSV23) 08/05/2016    Colorectal cancer screening: Type of screening: Colonoscopy. Completed 11/17/2017. Repeat every 5 years  Lung Cancer Screening: (Low Dose CT Chest recommended if Age 2-80 years, 30 pack-year currently smoking OR have quit w/in 15years.) does qualify.   Lung Cancer Screening Referral: no  Additional Screening:  Hepatitis C Screening: does qualify; Completed yes  Vision Screening: Recommended annual ophthalmology exams for early detection of glaucoma and other disorders of the eye. Is the patient up to date with their annual eye exam?  Yes  Who is the provider or what is the name of the office in which the patient attends annual eye exams? [facility] If pt is not established with a provider, would they like to be referred to a provider to establish care? No .   Dental Screening: Recommended annual dental exams for proper oral hygiene  Community Resource Referral / Chronic Care Management: CRR required this visit?  No   CCM required this visit?  No      Plan:     I have personally reviewed and noted the following in the patient's chart:   . Medical and social history . Use of alcohol, tobacco or illicit drugs  . Current medications and supplements . Functional ability and status . Nutritional status . Physical activity . Advanced directives . List of other physicians . Hospitalizations, surgeries, and ER visits in previous 12 months . Vitals . Screenings to include cognitive, depression, and falls . Referrals and appointments  In addition, I have reviewed and discussed with patient certain preventive protocols, quality metrics, and best practice recommendations. A written personalized care plan for preventive services as well as general preventive health recommendations were provided to patient.     Sheral Flow, LPN   06/24/5636   Nurse Notes:   Medications reviewed with patient; no opioid use noted.

## 2020-04-25 ENCOUNTER — Ambulatory Visit: Payer: Medicare Other | Admitting: Internal Medicine

## 2020-04-29 ENCOUNTER — Ambulatory Visit: Payer: Medicare Other | Admitting: Internal Medicine

## 2020-05-14 ENCOUNTER — Telehealth: Payer: Self-pay | Admitting: Emergency Medicine

## 2020-05-14 DIAGNOSIS — D485 Neoplasm of uncertain behavior of skin: Secondary | ICD-10-CM

## 2020-05-14 NOTE — Telephone Encounter (Signed)
Pt stated that in his last appt he was told to let Dr Alain Marion know if he would like a referral put in for Dermatology.  He has decided that he would like that referral put in. Thanks.

## 2020-05-14 NOTE — Telephone Encounter (Signed)
Please remind me what it was in reference to.  Thanks

## 2020-05-15 NOTE — Telephone Encounter (Signed)
Called pt there was no answer LMOM RTC w/reason for referral../lmb

## 2020-05-16 NOTE — Telephone Encounter (Signed)
Pt says he has a couple of spots on his head & torso.  States you put liquid nitrogen on it, that was helpful for some, but some are persistent.  Also states there are a couple moles on his back that are "questionable" that he would like looked at.   States he was given a cream as well, but the spot on his forehead, top of his head, & neck are persistent.  He states he declined the offer for a derm referral at the time, but thinks he needs it now.  See OV from 10/27/18.

## 2020-05-17 NOTE — Telephone Encounter (Signed)
LVM instructing pt that referral he requested has been placed & he should here from that office to schedule an appt.

## 2020-05-17 NOTE — Telephone Encounter (Signed)
Okay.  Thanks.  Done.

## 2020-05-17 NOTE — Addendum Note (Signed)
Addended by: Cassandria Anger on: 05/17/2020 12:53 PM   Modules accepted: Orders

## 2020-05-23 ENCOUNTER — Telehealth: Payer: Self-pay | Admitting: Physician Assistant

## 2020-05-23 NOTE — Telephone Encounter (Signed)
Plotnikov referral 10/24/20 @1 :45 W/KRS

## 2020-05-23 NOTE — Telephone Encounter (Signed)
Referral attached to appointment

## 2020-05-24 DIAGNOSIS — Z23 Encounter for immunization: Secondary | ICD-10-CM | POA: Diagnosis not present

## 2020-05-30 ENCOUNTER — Other Ambulatory Visit: Payer: Self-pay

## 2020-06-03 ENCOUNTER — Other Ambulatory Visit: Payer: Self-pay

## 2020-06-03 ENCOUNTER — Ambulatory Visit (INDEPENDENT_AMBULATORY_CARE_PROVIDER_SITE_OTHER): Payer: Medicare Other | Admitting: Internal Medicine

## 2020-06-03 ENCOUNTER — Encounter: Payer: Self-pay | Admitting: Internal Medicine

## 2020-06-03 DIAGNOSIS — S134XXS Sprain of ligaments of cervical spine, sequela: Secondary | ICD-10-CM | POA: Diagnosis not present

## 2020-06-03 DIAGNOSIS — E785 Hyperlipidemia, unspecified: Secondary | ICD-10-CM

## 2020-06-03 DIAGNOSIS — M545 Low back pain, unspecified: Secondary | ICD-10-CM

## 2020-06-03 DIAGNOSIS — I2510 Atherosclerotic heart disease of native coronary artery without angina pectoris: Secondary | ICD-10-CM

## 2020-06-03 DIAGNOSIS — G8929 Other chronic pain: Secondary | ICD-10-CM

## 2020-06-03 DIAGNOSIS — D696 Thrombocytopenia, unspecified: Secondary | ICD-10-CM
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
## 2020-06-03 NOTE — Assessment & Plan Note (Signed)
On Crestor 

## 2020-06-03 NOTE — Assessment & Plan Note (Signed)
F/u w/Dr Ennever 
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
## 2020-06-03 NOTE — Assessment & Plan Note (Signed)
Better  

## 2020-06-03 NOTE — Assessment & Plan Note (Signed)
Stretch lower back

## 2020-06-10 ENCOUNTER — Other Ambulatory Visit: Payer: Self-pay | Admitting: Internal Medicine

## 2020-07-04 ENCOUNTER — Telehealth: Payer: Self-pay | Admitting: Internal Medicine

## 2020-07-04 NOTE — Telephone Encounter (Signed)
Patients wife calling, states that when they came in together her appointment was billed but this patients wasn't. She would like someone to call her back to go over this.

## 2020-08-01 ENCOUNTER — Ambulatory Visit: Payer: Medicare Other | Admitting: Internal Medicine

## 2020-08-07 ENCOUNTER — Ambulatory Visit (INDEPENDENT_AMBULATORY_CARE_PROVIDER_SITE_OTHER): Payer: Medicare Other | Admitting: Internal Medicine

## 2020-08-07 ENCOUNTER — Other Ambulatory Visit: Payer: Self-pay

## 2020-08-07 ENCOUNTER — Encounter: Payer: Self-pay | Admitting: Internal Medicine

## 2020-08-07 DIAGNOSIS — M79642 Pain in left hand: Secondary | ICD-10-CM | POA: Diagnosis not present

## 2020-08-07 DIAGNOSIS — M545 Low back pain, unspecified: Secondary | ICD-10-CM | POA: Diagnosis not present

## 2020-08-07 DIAGNOSIS — G8929 Other chronic pain: Secondary | ICD-10-CM | POA: Diagnosis not present

## 2020-08-07 DIAGNOSIS — I2510 Atherosclerotic heart disease of native coronary artery without angina pectoris: Secondary | ICD-10-CM

## 2020-08-07 DIAGNOSIS — I2583 Coronary atherosclerosis due to lipid rich plaque: Secondary | ICD-10-CM

## 2020-08-07 DIAGNOSIS — N400 Enlarged prostate without lower urinary tract symptoms: Secondary | ICD-10-CM | POA: Diagnosis not present

## 2020-08-07 DIAGNOSIS — E785 Hyperlipidemia, unspecified: Secondary | ICD-10-CM | POA: Diagnosis not present

## 2020-08-07 DIAGNOSIS — S134XXS Sprain of ligaments of cervical spine, sequela: Secondary | ICD-10-CM | POA: Diagnosis not present

## 2020-08-07 MED ORDER — ROSUVASTATIN CALCIUM 5 MG PO TABS: ORAL_TABLET | ORAL | 3 refills | Status: DC

## 2020-08-07 MED ORDER — DOXAZOSIN MESYLATE 4 MG PO TABS: ORAL_TABLET | ORAL | 3 refills | Status: DC
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
## 2020-08-07 NOTE — Assessment & Plan Note (Signed)
Recovering  

## 2020-08-07 NOTE — Assessment & Plan Note (Addendum)
  Ganglion cyst vs trigger finger R 4th (w/o triggering yet) Voltaren gel Sports Med ref adviced

## 2020-08-07 NOTE — Assessment & Plan Note (Signed)
On Crestor 

## 2020-08-07 NOTE — Patient Instructions (Signed)
Voltaren gel 

## 2020-08-12 NOTE — Assessment & Plan Note (Signed)
Continue with Crestor 

## 2020-08-12 NOTE — Assessment & Plan Note (Signed)
Doing better.   

## 2020-08-12 NOTE — Assessment & Plan Note (Signed)
Continue with doxazosin

## 2020-10-01 ENCOUNTER — Inpatient Hospital Stay (HOSPITAL_BASED_OUTPATIENT_CLINIC_OR_DEPARTMENT_OTHER): Payer: Medicare Other | Admitting: Hematology & Oncology

## 2020-10-01 ENCOUNTER — Other Ambulatory Visit: Payer: Self-pay

## 2020-10-01 ENCOUNTER — Encounter: Payer: Self-pay | Admitting: Hematology & Oncology

## 2020-10-01 ENCOUNTER — Inpatient Hospital Stay: Payer: Medicare Other | Attending: Hematology & Oncology

## 2020-10-01 VITALS — BP 120/69 | HR 61 | Temp 98.1°F | Resp 17 | Wt 205.0 lb

## 2020-10-01 DIAGNOSIS — Z79899 Other long term (current) drug therapy: Secondary | ICD-10-CM | POA: Insufficient documentation

## 2020-10-01 DIAGNOSIS — Z886 Allergy status to analgesic agent status: Secondary | ICD-10-CM | POA: Diagnosis not present

## 2020-10-01 DIAGNOSIS — D61818 Other pancytopenia: Secondary | ICD-10-CM | POA: Insufficient documentation

## 2020-10-01 LAB — CMP (CANCER CENTER ONLY)
ALT: 19 U/L (ref 0–44)
AST: 15 U/L (ref 15–41)
Albumin: 4.4 g/dL (ref 3.5–5.0)
Alkaline Phosphatase: 54 U/L (ref 38–126)
Anion gap: 8 (ref 5–15)
BUN: 25 mg/dL — ABNORMAL HIGH (ref 8–23)
CO2: 25 mmol/L (ref 22–32)
Calcium: 9.4 mg/dL (ref 8.9–10.3)
Chloride: 105 mmol/L (ref 98–111)
Creatinine: 1.06 mg/dL (ref 0.61–1.24)
GFR, Estimated: >60 mL/min (ref >60)
Glucose, Bld: 102 mg/dL — ABNORMAL HIGH (ref 70–99)
Potassium: 4.2 mmol/L (ref 3.5–5.1)
Sodium: 138 mmol/L (ref 135–145)
Total Bilirubin: 0.8 mg/dL (ref 0.3–1.2)
Total Protein: 6.3 g/dL — ABNORMAL LOW (ref 6.5–8.1)

## 2020-10-01 LAB — CBC WITH DIFFERENTIAL (CANCER CENTER ONLY)
Abs Immature Granulocytes: 0.02 10*3/uL (ref 0.00–0.07)
Basophils Absolute: 0 10*3/uL (ref 0.0–0.1)
Basophils Relative: 0 %
Eosinophils Absolute: 0.1 10*3/uL (ref 0.0–0.5)
Eosinophils Relative: 2 %
HCT: 35.6 % — ABNORMAL LOW (ref 39.0–52.0)
Hemoglobin: 12.6 g/dL — ABNORMAL LOW (ref 13.0–17.0)
Immature Granulocytes: 1 %
Lymphocytes Relative: 30 %
Lymphs Abs: 1.1 10*3/uL (ref 0.7–4.0)
MCH: 32.3 pg (ref 26.0–34.0)
MCHC: 35.4 g/dL (ref 30.0–36.0)
MCV: 91.3 fL (ref 80.0–100.0)
Monocytes Absolute: 0.4 10*3/uL (ref 0.1–1.0)
Monocytes Relative: 11 %
Neutro Abs: 2.1 10*3/uL (ref 1.7–7.7)
Neutrophils Relative %: 56 %
Platelet Count: 117 10*3/uL — ABNORMAL LOW (ref 150–400)
RBC: 3.9 MIL/uL — ABNORMAL LOW (ref 4.22–5.81)
RDW: 12 % (ref 11.5–15.5)
WBC Count: 3.7 10*3/uL — ABNORMAL LOW (ref 4.0–10.5)
nRBC: 0 % (ref 0.0–0.2)

## 2020-10-01 LAB — RETICULOCYTES
Immature Retic Fract: 8.5 % (ref 2.3–15.9)
RBC.: 3.84 MIL/uL — ABNORMAL LOW (ref 4.22–5.81)
Retic Count, Absolute: 62.2 10*3/uL (ref 19.0–186.0)
Retic Ct Pct: 1.6 % (ref 0.4–3.1)

## 2020-10-01 LAB — LACTATE DEHYDROGENASE: LDH: 142 U/L (ref 98–192)

## 2020-10-01 LAB — SAVE SMEAR(SSMR), FOR PROVIDER SLIDE REVIEW

## 2020-10-01 NOTE — Progress Notes (Signed)
Hematology and Oncology Follow Up Visit  ION WIESNER [id number] 1950/12/09 70 y.o. 10/01/2020   Principle Diagnosis:  Pancytopenia -- mild  Current Therapy:   Observation     Interim History:  Mr. Malick is back for follow-up.  He is doing pretty well.  He comes in with his wife.  Everybody is doing quite well.  He and his wife are very cautious with the coronavirus.  They really do not go anywhere.  He got for some walks.  They do not do any traveling.  He has had no problems with nausea or vomiting.  There is been no issues with cough or shortness of breath.  He has had no rashes.  There is been no swollen lymph nodes.  He has had no change in bowel or bladder habits.  His appetite has been doing well.  Overall, I would say his performance status is by ECOG 0.    Medications:  Current Outpatient Medications:    b complex vitamins capsule, Take 1 capsule by mouth daily., Disp: , Rfl:    Cholecalciferol (VITAMIN D) 2000 UNITS CAPS, Take by mouth daily., Disp: , Rfl:    doxazosin (CARDURA) 4 MG tablet, TAKE 1 TABLET BY MOUTH DAILY. GENERIC EQUIVALENT FOR CARDURA, Disp: 90 tablet, Rfl: 3   Flaxseed, Linseed, (FLAXSEED OIL PO), Take by mouth. Takes 1400 mg daily, Disp: , Rfl:    Multiple Vitamin (MULTIVITAMIN) tablet, Take 1 tablet by mouth daily., Disp: , Rfl:    OVER THE COUNTER MEDICATION, Lion's Mane (OTC) take 1 by mouth daily, Disp: , Rfl:    rosuvastatin (CRESTOR) 5 MG tablet, TAKE 1 TABLET(5 MG) BY MOUTH DAILY, Disp: 90 tablet, Rfl: 3   Thiamine HCl (VITAMIN B-1 PO), Take by mouth daily., Disp: , Rfl:    triamcinolone cream (KENALOG) 0.1 %, Apply 1 application topically 2 (two) times daily., Disp: 45 g, Rfl: 0   Turmeric (QC TUMERIC COMPLEX PO), Take by mouth., Disp: , Rfl:    vitamin C (ASCORBIC ACID) 500 MG tablet, Take 500 mg by mouth daily., Disp: , Rfl:   Allergies:  Allergies  Allergen Reactions   Aspirin Rash    Past Medical History, Surgical history,  Social history, and Family History were reviewed and updated.  Review of Systems: Review of Systems  Constitutional: Negative.   HENT:  Negative.    Eyes: Negative.   Respiratory: Negative.    Cardiovascular: Negative.   Gastrointestinal: Negative.   Endocrine: Negative.   Genitourinary: Negative.    Musculoskeletal: Negative.   Skin: Negative.   Neurological: Negative.   Hematological: Negative.   Psychiatric/Behavioral: Negative.     Physical Exam:  weight is 205 lb (93 kg). His oral temperature is 98.1 F (36.7 C). His blood pressure is 120/69 and his pulse is 61. His respiration is 17 and oxygen saturation is 98%.   Wt Readings from Last 3 Encounters:  10/01/20 205 lb (93 kg)  08/07/20 211 lb 6.4 oz (95.9 kg)  06/03/20 218 lb 6.4 oz (99.1 kg)    Physical Exam Vitals reviewed.  HENT:     Head: Normocephalic and atraumatic.  Eyes:     Pupils: Pupils are equal, round, and reactive to light.  Cardiovascular:     Rate and Rhythm: Normal rate and regular rhythm.     Heart sounds: Normal heart sounds.  Pulmonary:     Effort: Pulmonary effort is normal.     Breath sounds: Normal breath sounds.  Abdominal:  General: Bowel sounds are normal.     Palpations: Abdomen is soft.  Musculoskeletal:        General: No tenderness or deformity. Normal range of motion.     Cervical back: Normal range of motion.  Lymphadenopathy:     Cervical: No cervical adenopathy.  Skin:    General: Skin is warm and dry.     Findings: No erythema or rash.  Neurological:     Mental Status: He is alert and oriented to person, place, and time.  Psychiatric:        Behavior: Behavior normal.        Thought Content: Thought content normal.        Judgment: Judgment normal.     Lab Results  Component Value Date   WBC 3.7 (L) 10/01/2020   HGB 12.6 (L) 10/01/2020   HCT 35.6 (L) 10/01/2020   MCV 91.3 10/01/2020   PLT 117 (L) 10/01/2020     Chemistry      Component Value Date/Time    NA 138 10/01/2020 1001   K 4.2 10/01/2020 1001   CL 105 10/01/2020 1001   CO2 25 10/01/2020 1001   BUN 25 (H) 10/01/2020 1001   CREATININE 1.06 10/01/2020 1001   CREATININE 1.13 10/30/2019 0850      Component Value Date/Time   CALCIUM 9.4 10/01/2020 1001   ALKPHOS 54 10/01/2020 1001   AST 15 10/01/2020 1001   ALT 19 10/01/2020 1001   BILITOT 0.8 10/01/2020 1001      Impression and Plan: Mr. Bagshaw is a very nice 70 year old white male.  Here is really from [place].  He has a really wonderful last name.  I did look at his blood smear under the microscope.  I do not see any immature myeloid cells.  He has no nucleated red blood cells.  There are no hypersegmented polys.  Platelets are adequate in number and size.  From my point of view, everything still looks fantastic.  I really cannot find anything that suggest a problem with respect to the pancytopenia.  I suppose it could have myelodysplasia.  However, I just do not see that we have to be invasive and do a bone marrow test on him.  I really think that we can get him back to see [place] after all the holidays.  He looks great.  He and his wife are very cautious with the coronavirus so I just do not want to have him come out to many times.  It is fun talking to him and his wife.    Volanda Napoleon, MD 8/16/202211:15 AM

## 2020-10-04 ENCOUNTER — Encounter: Payer: Self-pay | Admitting: Hematology & Oncology

## 2020-10-17 DIAGNOSIS — Z23 Encounter for immunization: Secondary | ICD-10-CM | POA: Diagnosis not present

## 2020-10-24 ENCOUNTER — Encounter: Payer: Self-pay | Admitting: Physician Assistant

## 2020-10-24 ENCOUNTER — Ambulatory Visit (INDEPENDENT_AMBULATORY_CARE_PROVIDER_SITE_OTHER): Payer: Medicare Other | Admitting: Physician Assistant

## 2020-10-24 ENCOUNTER — Other Ambulatory Visit: Payer: Self-pay

## 2020-10-24 DIAGNOSIS — L570 Actinic keratosis: Secondary | ICD-10-CM

## 2020-10-24 DIAGNOSIS — C4491 Basal cell carcinoma of skin, unspecified: Secondary | ICD-10-CM

## 2020-10-24 DIAGNOSIS — Z1283 Encounter for screening for malignant neoplasm of skin: Secondary | ICD-10-CM | POA: Diagnosis not present

## 2020-10-24 DIAGNOSIS — C44329 Squamous cell carcinoma of skin of other parts of face: Secondary | ICD-10-CM | POA: Diagnosis not present

## 2020-10-24 DIAGNOSIS — C4441 Basal cell carcinoma of skin of scalp and neck: Secondary | ICD-10-CM | POA: Diagnosis not present

## 2020-10-24 DIAGNOSIS — D0439 Carcinoma in situ of skin of other parts of face: Secondary | ICD-10-CM | POA: Diagnosis not present

## 2020-10-24 DIAGNOSIS — C4492 Squamous cell carcinoma of skin, unspecified: Secondary | ICD-10-CM

## 2020-10-24 DIAGNOSIS — D485 Neoplasm of uncertain behavior of skin: Secondary | ICD-10-CM

## 2020-10-24 HISTORY — DX: Basal cell carcinoma of skin, unspecified: C44.91

## 2020-10-24 HISTORY — DX: Squamous cell carcinoma of skin, unspecified: C44.92

## 2020-10-24 NOTE — Patient Instructions (Signed)

## 2020-10-29 ENCOUNTER — Encounter: Payer: Self-pay | Admitting: Physician Assistant

## 2020-10-29 NOTE — Progress Notes (Signed)
New Patient   Subjective  Richard [name] is a 70 y.o. male who presents for the following: Annual Exam (Here to have some areas checked that are not healing. Mostly on scalp, face and neck. Patients PCP did try to use liquid nitrogen but they did not heal. Also has some moles on back he wants checked. No history of skin cancer or family history of skin cancer. ).   The following portions of the chart were reviewed this encounter and updated as appropriate:  Tobacco  Allergies  Meds  Problems  Med Hx  Surg Hx  Fam Hx      Objective  Well appearing patient in no apparent distress; mood and affect are within normal limits.  A full examination was performed including scalp, head, eyes, ears, nose, lips, neck, chest, axillae, abdomen, back, buttocks, bilateral upper extremities, bilateral lower extremities, hands, feet, fingers, toes, fingernails, and toenails. All findings within normal limits unless otherwise noted below.  face and scalp (8) Erythematous patches with gritty scale.  Mid Forehead Hyperkeratotic scale with pink base      Left Anterior Neck Pearly papule with telangectasia.      Right Temple Hyperkeratotic scale with pink base      Assessment & Plan  AK (actinic keratosis) (8) face and scalp  Destruction of lesion - face and scalp Complexity: simple   Destruction method: cryotherapy   Informed consent: discussed and consent obtained   Timeout:  patient name, date of birth, surgical site, and procedure verified Lesion destroyed using liquid nitrogen: Yes   Cryotherapy cycles:  1 Outcome: patient tolerated procedure well with no complications   Post-procedure details: wound care instructions given    Neoplasm of uncertain behavior of skin (3) Mid Forehead  Skin / nail biopsy Type of biopsy: tangential   Informed consent: discussed and consent obtained   Timeout: patient name, date of birth, surgical site, and procedure verified   Anesthesia:  the lesion was anesthetized in a standard fashion   Anesthetic:  1% lidocaine w/ epinephrine 1-100,000 local infiltration Instrument used: flexible razor blade   Hemostasis achieved with: aluminum chloride and electrodesiccation   Outcome: patient tolerated procedure well   Post-procedure details: wound care instructions given    Specimen 1 - Surgical pathology Differential Diagnosis: bcc scc  Check Margins: No  Left Anterior Neck  Skin / nail biopsy Type of biopsy: tangential   Informed consent: discussed and consent obtained   Timeout: patient name, date of birth, surgical site, and procedure verified   Anesthesia: the lesion was anesthetized in a standard fashion   Anesthetic:  1% lidocaine w/ epinephrine 1-100,000 local infiltration Instrument used: flexible razor blade   Hemostasis achieved with: aluminum chloride and electrodesiccation   Outcome: patient tolerated procedure well   Post-procedure details: wound care instructions given    Specimen 2 - Surgical pathology Differential Diagnosis: bcc scc  Check Margins: No  Right Temple  Skin / nail biopsy Type of biopsy: tangential   Informed consent: discussed and consent obtained   Timeout: patient name, date of birth, surgical site, and procedure verified   Anesthesia: the lesion was anesthetized in a standard fashion   Anesthetic:  1% lidocaine w/ epinephrine 1-100,000 local infiltration Instrument used: flexible razor blade   Hemostasis achieved with: aluminum chloride and electrodesiccation   Outcome: patient tolerated procedure well   Post-procedure details: wound care instructions given    Specimen 3 - Surgical pathology Differential Diagnosis: bcc scc  Check Margins:  No    I, Rein Popov, PA-C, have reviewed all documentation's for this visit.  The documentation on 10/29/20 for the exam, diagnosis, procedures and orders are all accurate and complete.

## 2020-10-30 ENCOUNTER — Telehealth: Payer: Self-pay

## 2020-10-30 NOTE — Telephone Encounter (Signed)
Patient aware of results and will start with [facility] first

## 2020-10-30 NOTE — Telephone Encounter (Signed)
-----   Message from Warren Danes, [place] sent at 10/29/2020  4:16 PM EDT ----- 2 and 3 [facility] 1 30 [place]

## 2020-11-01 DIAGNOSIS — Z23 Encounter for immunization: Secondary | ICD-10-CM | POA: Diagnosis not present

## 2020-11-14 ENCOUNTER — Telehealth: Payer: Self-pay | Admitting: Physician Assistant

## 2020-11-14 NOTE — Telephone Encounter (Signed)
Patient left message on office voice mail that he is calling for pathology results from last visit with [facility], PA-C.  Patient states he cannot find anything on MyChart.

## 2020-11-14 NOTE — Telephone Encounter (Signed)
Patient reminded we spoke 9/14 and referral complete just waiting on surgical visit from [facility]

## 2020-12-26 DIAGNOSIS — D0439 Carcinoma in situ of skin of other parts of face: Secondary | ICD-10-CM | POA: Diagnosis not present

## 2020-12-26 DIAGNOSIS — C44329 Squamous cell carcinoma of skin of other parts of face: Secondary | ICD-10-CM | POA: Diagnosis not present

## 2021-01-15 DIAGNOSIS — C4441 Basal cell carcinoma of skin of scalp and neck: Secondary | ICD-10-CM | POA: Diagnosis not present

## 2021-02-10 IMAGING — CT CT CARDIAC CORONARY ARTERY CALCIUM SCORE
3 series · 14 of 20 positions shown, 15 images · non-contrast
Comparison: None.

Addendum:
CLINICAL DATA: Risk stratification

EXAM:
Coronary Calcium Score
TECHNIQUE: The patient was scanned on a Siemens Force scanner. Axial
non-contrast 3 mm slices were carried out through the heart. The
data set was analyzed on a dedicated work station and scored using
the Agatson method.

[Series 2: casc 3.0 bv41 2 bestdiast 68 % · axial · 0.36mm/px · z∈[-278,-188]mm · 4 of 51 slices shown, 5 images]
[im 11/51  vessel]
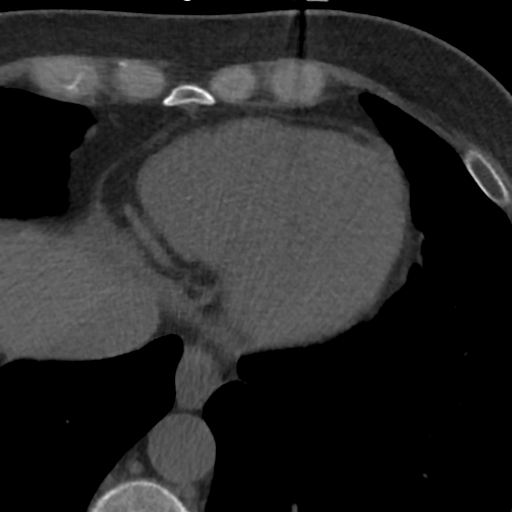
[im 11/51  lung]
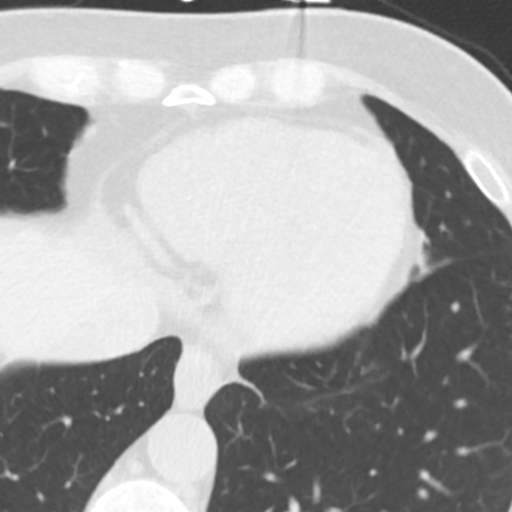
[im 21/51  vessel]
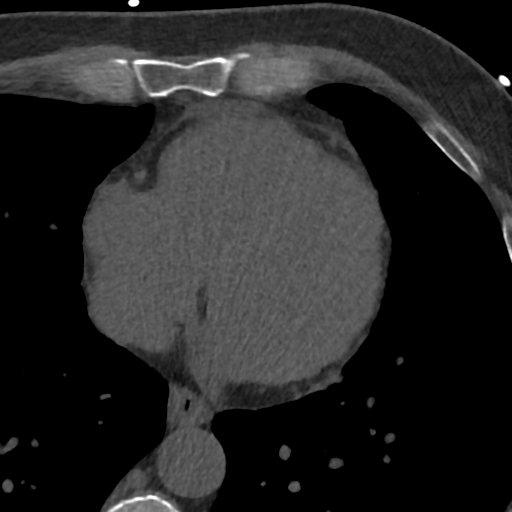
[im 31/51  vessel]
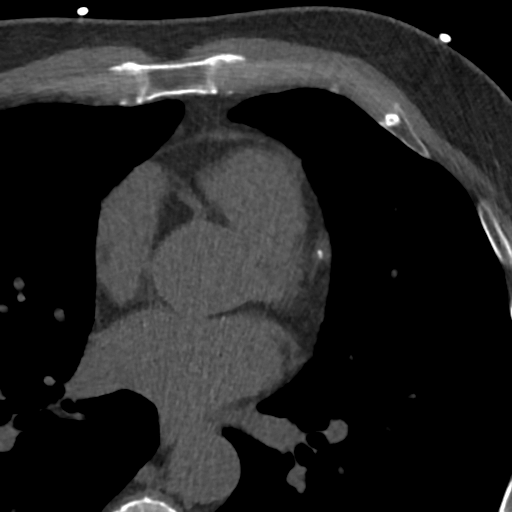
[im 41/51  vessel]
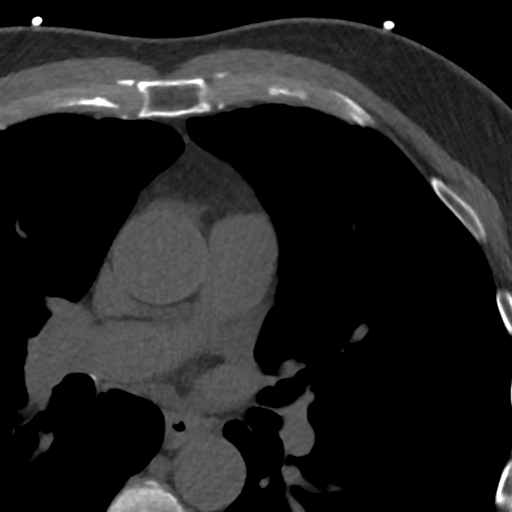

[Series 3: lung 71 % · axial · 0.68mm/px · z∈[-284,-185]mm · 5 of 51 slices shown]
[im 9/51  lung]
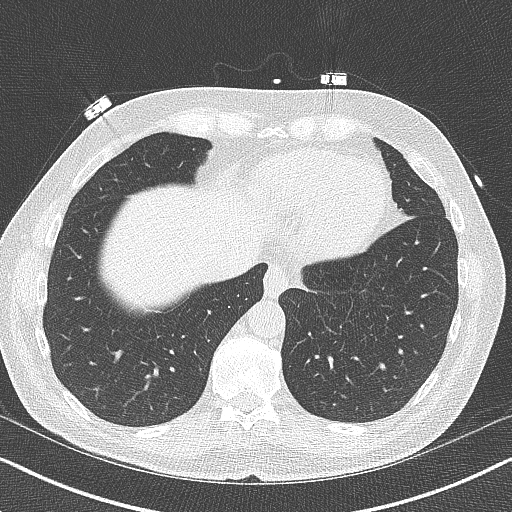
[im 17/51  lung]
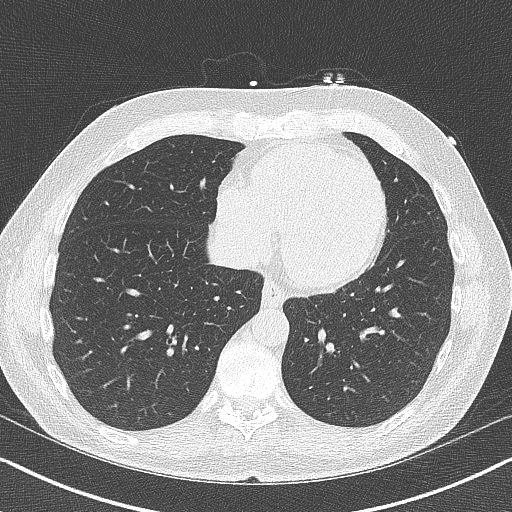
[im 26/51  lung]
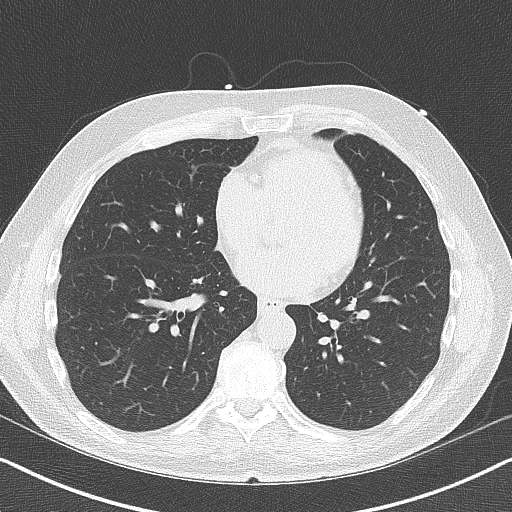
[im 34/51  lung]
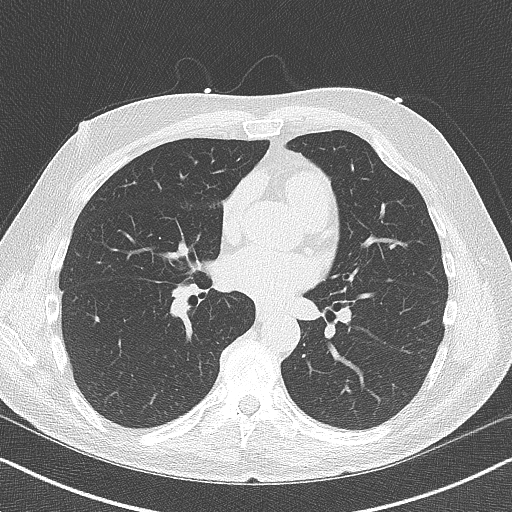
[im 42/51  lung]
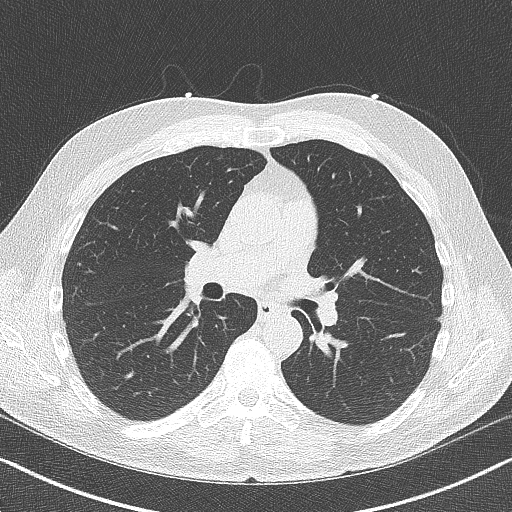

[Series 4: lung st 71 % · axial · 0.68mm/px · z∈[-284,-185]mm · 5 of 51 slices shown]
[im 9/51  lung]
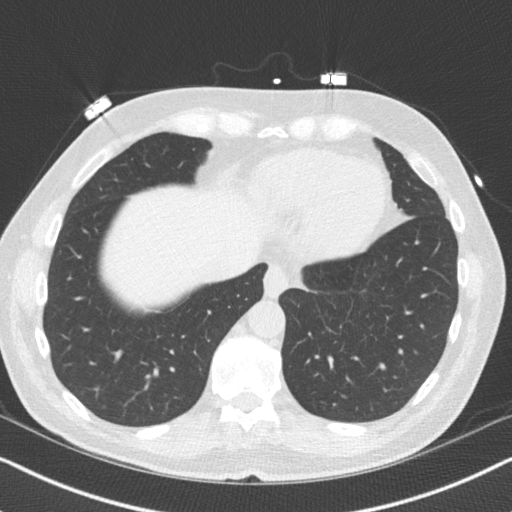
[im 17/51  lung]
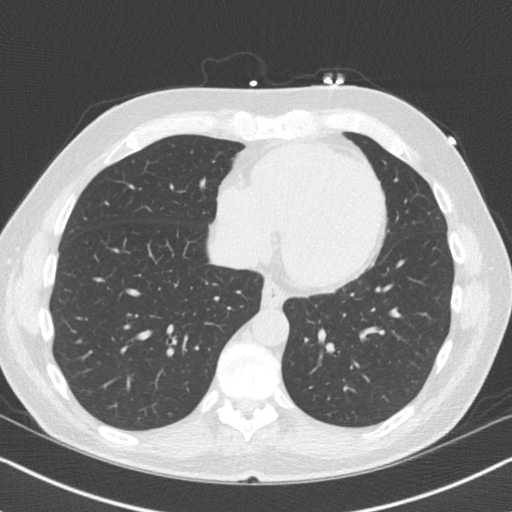
[im 26/51  lung]
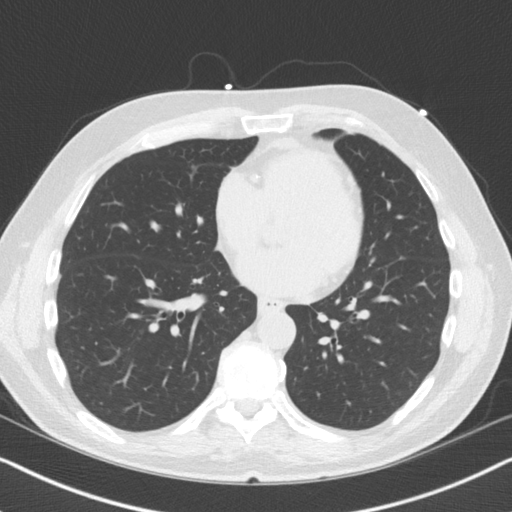
[im 34/51  lung]
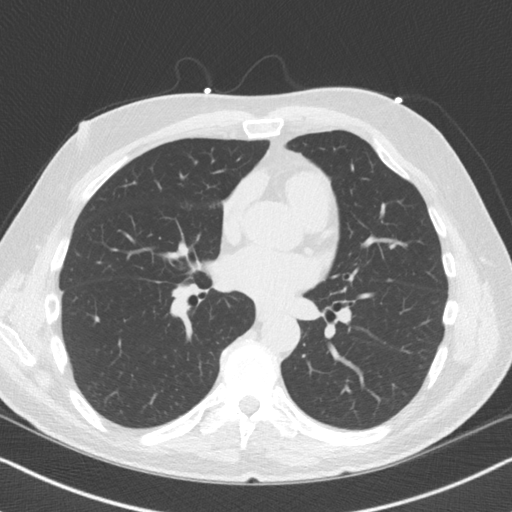
[im 42/51  lung]
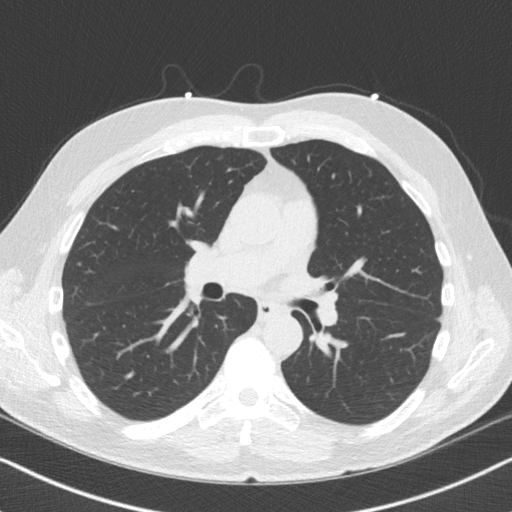

[14 of 20 positions shown; findings below may reference images not displayed]

FINDINGS: Non-cardiac: See separate report from [REDACTED].

Ascending Aorta: Normal Caliber.  No Calcifications.

Pericardium: Normal

Coronary arteries: Normal coronary origins.
IMPRESSION: Coronary calcium score of 73. This was 40th percentile for age and
sex matched control.

[employer] [place]

EXAM:
OVER-READ INTERPRETATION  CT CHEST

The following report is an over-read performed by radiologist Dr.
over-read does not include interpretation of cardiac or coronary
anatomy or pathology. The coronary calcium score interpretation by
the cardiologist is attached.
FINDINGS: Calcified granuloma in the right lower lobe. Within the visualized
portions of the thorax there are no other suspicious appearing
pulmonary nodules or masses, there is no acute consolidative
airspace disease, no pleural effusions, no pneumothorax and no
lymphadenopathy. Visualized portions of the upper abdomen are
unremarkable. There are no aggressive appearing lytic or blastic
lesions noted in the visualized portions of the skeleton.
IMPRESSION: 1. Calcified granuloma in the right lower lobe incidentally noted.
No other significant incidental noncardiac findings are noted.

*** End of Addendum ***
FINDINGS: Non-cardiac: See separate report from [REDACTED].

Ascending Aorta: Normal Caliber.  No Calcifications.

Pericardium: Normal

Coronary arteries: Normal coronary origins.
IMPRESSION: Coronary calcium score of 73. This was 40th percentile for age and
sex matched control.

[employer] [place]

## 2021-02-12 ENCOUNTER — Other Ambulatory Visit: Payer: Self-pay

## 2021-02-12 ENCOUNTER — Ambulatory Visit (INDEPENDENT_AMBULATORY_CARE_PROVIDER_SITE_OTHER): Payer: Medicare Other | Admitting: Internal Medicine

## 2021-02-12 ENCOUNTER — Encounter: Payer: Self-pay | Admitting: Internal Medicine

## 2021-02-12 VITALS — BP 108/74 | HR 64 | Temp 97.9°F | Ht 72.0 in | Wt 208.6 lb

## 2021-02-12 DIAGNOSIS — N320 Bladder-neck obstruction: Secondary | ICD-10-CM

## 2021-02-12 DIAGNOSIS — D61818 Other pancytopenia: Secondary | ICD-10-CM

## 2021-02-12 DIAGNOSIS — L570 Actinic keratosis: Secondary | ICD-10-CM | POA: Diagnosis not present

## 2021-02-12 DIAGNOSIS — N400 Enlarged prostate without lower urinary tract symptoms: Secondary | ICD-10-CM | POA: Diagnosis not present

## 2021-02-12 DIAGNOSIS — C4490 Unspecified malignant neoplasm of skin, unspecified: Secondary | ICD-10-CM | POA: Insufficient documentation

## 2021-02-12 DIAGNOSIS — E785 Hyperlipidemia, unspecified: Secondary | ICD-10-CM | POA: Diagnosis not present

## 2021-02-12 LAB — COMPREHENSIVE METABOLIC PANEL
ALT: 19 U/L (ref 0–53)
AST: 16 U/L (ref 0–37)
Albumin: 4.4 g/dL (ref 3.5–5.2)
Alkaline Phosphatase: 58 U/L (ref 39–117)
BUN: 33 mg/dL — ABNORMAL HIGH (ref 6–23)
CO2: 27 mEq/L (ref 19–32)
Calcium: 9.2 mg/dL (ref 8.4–10.5)
Chloride: 104 mEq/L (ref 96–112)
Creatinine, Ser: 1.18 mg/dL (ref 0.40–1.50)
GFR: 62.07 mL/min (ref >60.00)
Glucose, Bld: 95 mg/dL (ref 70–99)
Potassium: 4.3 mEq/L (ref 3.5–5.1)
Sodium: 137 mEq/L (ref 135–145)
Total Bilirubin: 0.9 mg/dL (ref 0.2–1.2)
Total Protein: 6.7 g/dL (ref 6.0–8.3)

## 2021-02-12 LAB — LIPID PANEL
Cholesterol: 128 mg/dL (ref 0–200)
HDL: 37.6 mg/dL — ABNORMAL LOW (ref >39.00)
LDL Cholesterol: 54 mg/dL (ref 0–99)
NonHDL: 90.01
Total CHOL/HDL Ratio: 3
Triglycerides: 182 mg/dL — ABNORMAL HIGH (ref 0.0–149.0)
VLDL: 36.4 mg/dL (ref 0.0–40.0)

## 2021-02-12 LAB — TSH: TSH: 3.15 u[IU]/mL (ref 0.35–5.50)

## 2021-02-12 LAB — PSA: PSA: 1.1 ng/mL (ref 0.10–4.00)

## 2021-02-12 NOTE — Assessment & Plan Note (Signed)
Cont on Doxazosin at night

## 2021-02-12 NOTE — Assessment & Plan Note (Addendum)
[facility]

## 2021-02-12 NOTE — Progress Notes (Signed)
Subjective:  Patient ID: Michel Harrow, male    DOB: 1950-04-19  Age: 70 y.o. MRN: [id number]  CC: Follow-up (6 month follow up)   HPI [employer] presents for abn CBC, skin cancer, BPH f/u  Outpatient Medications Prior to Visit  Medication Sig Dispense Refill   b complex vitamins capsule Take 1 capsule by mouth daily.     Cholecalciferol (VITAMIN D) 2000 UNITS CAPS Take by mouth daily.     doxazosin (CARDURA) 4 MG tablet TAKE 1 TABLET BY MOUTH DAILY. GENERIC EQUIVALENT FOR CARDURA 90 tablet 3   Flaxseed, Linseed, (FLAXSEED OIL PO) Take by mouth. Takes 1400 mg daily     Multiple Vitamin (MULTIVITAMIN) tablet Take 1 tablet by mouth daily.     OVER THE COUNTER MEDICATION Lion's Mane (OTC) take 1 by mouth daily     rosuvastatin (CRESTOR) 5 MG tablet TAKE 1 TABLET(5 MG) BY MOUTH DAILY 90 tablet 3   Thiamine HCl (VITAMIN B-1 PO) Take by mouth daily.     triamcinolone cream (KENALOG) 0.1 % Apply 1 application topically 2 (two) times daily. 45 g 0   Turmeric (QC TUMERIC COMPLEX PO) Take by mouth.     vitamin C (ASCORBIC ACID) 500 MG tablet Take 500 mg by mouth daily.     No facility-administered medications prior to visit.    ROS: Review of Systems  Constitutional:  Negative for appetite change, fatigue and unexpected weight change.  HENT:  Negative for congestion, nosebleeds, sneezing, sore throat and trouble swallowing.   Eyes:  Negative for itching and visual disturbance.  Respiratory:  Negative for cough.   Cardiovascular:  Negative for chest pain, palpitations and leg swelling.  Gastrointestinal:  Negative for abdominal distention, blood in stool, diarrhea and nausea.  Genitourinary:  Negative for frequency and hematuria.  Musculoskeletal:  Negative for back pain, gait problem, joint swelling and neck pain.  Skin:  Negative for rash.  Neurological:  Negative for dizziness, tremors, speech difficulty and weakness.  Psychiatric/Behavioral:  Negative for agitation,  dysphoric mood and sleep disturbance. The patient is not nervous/anxious.    Objective:  BP 108/74 (BP Location: Left Arm, Patient Position: Sitting, Cuff Size: Large)    Pulse 64    Temp 97.9 F (36.6 C) (Oral)    Ht 6' (1.829 m)    Wt 208 lb 9.6 oz (94.6 kg)    SpO2 97%    BMI 28.29 kg/m   BP Readings from Last 3 Encounters:  02/12/21 108/74  10/01/20 120/69  08/07/20 110/72    Wt Readings from Last 3 Encounters:  02/12/21 208 lb 9.6 oz (94.6 kg)  10/01/20 205 lb (93 kg)  08/07/20 211 lb 6.4 oz (95.9 kg)    Physical Exam Constitutional:      General: He is not in acute distress.    Appearance: He is well-developed.     Comments: NAD  Eyes:     Conjunctiva/sclera: Conjunctivae normal.     Pupils: Pupils are equal, round, and reactive to light.  Neck:     Thyroid: No thyromegaly.     Vascular: No JVD.  Cardiovascular:     Rate and Rhythm: Normal rate and regular rhythm.     Heart sounds: Normal heart sounds. No murmur heard.   No friction rub. No gallop.  Pulmonary:     Effort: Pulmonary effort is normal. No respiratory distress.     Breath sounds: Normal breath sounds. No wheezing or rales.  Chest:  Chest wall: No tenderness.  Abdominal:     General: Bowel sounds are normal. There is no distension.     Palpations: Abdomen is soft. There is no mass.     Tenderness: There is no abdominal tenderness. There is no guarding or rebound.  Musculoskeletal:        General: No tenderness. Normal range of motion.     Cervical back: Normal range of motion.  Lymphadenopathy:     Cervical: No cervical adenopathy.  Skin:    General: Skin is warm and dry.     Findings: No rash.  Neurological:     Mental Status: He is alert and oriented to person, place, and time.     Cranial Nerves: No cranial nerve deficit.     Motor: No abnormal muscle tone.     Coordination: Coordination normal.     Gait: Gait normal.     Deep Tendon Reflexes: Reflexes are normal and symmetric.   Psychiatric:        Behavior: Behavior normal.        Thought Content: Thought content normal.        Judgment: Judgment normal.  Forehead scar  Lab Results  Component Value Date   WBC 3.7 (L) 10/01/2020   HGB 12.6 (L) 10/01/2020   HCT 35.6 (L) 10/01/2020   PLT 117 (L) 10/01/2020   GLUCOSE 102 (H) 10/01/2020   CHOL 122 10/30/2019   TRIG 83 10/30/2019   HDL 44 10/30/2019   LDLDIRECT 80.9 04/16/2011   LDLCALC 61 10/30/2019   ALT 19 10/01/2020   AST 15 10/01/2020   NA 138 10/01/2020   K 4.2 10/01/2020   CL 105 10/01/2020   CREATININE 1.06 10/01/2020   BUN 25 (H) 10/01/2020   CO2 25 10/01/2020   TSH 2.18 10/30/2019   PSA 0.70 10/30/2019   HGBA1C 5.1 10/27/2018    CT CARDIAC SCORING  Addendum Date: 07/18/2019   ADDENDUM REPORT: 07/18/2019 15:15 EXAM: OVER-READ INTERPRETATION  CT CHEST The following report is an over-read performed by radiologist Dr. Rebekah Chesterfield [facility] on 07/18/2019. This over-read does not include interpretation of cardiac or coronary anatomy or pathology. The coronary calcium score interpretation by the cardiologist is attached. COMPARISON:  None. FINDINGS: Calcified granuloma in the right lower lobe. Within the visualized portions of the thorax there are no other suspicious appearing pulmonary nodules or masses, there is no acute consolidative airspace disease, no pleural effusions, no pneumothorax and no lymphadenopathy. Visualized portions of the upper abdomen are unremarkable. There are no aggressive appearing lytic or blastic lesions noted in the visualized portions of the skeleton. IMPRESSION: 1. Calcified granuloma in the right lower lobe incidentally noted. No other significant incidental noncardiac findings are noted. Electronically Signed   By: Vinnie Langton M.D.   On: 07/18/2019 15:15   Result Date: 07/18/2019 CLINICAL DATA:  Risk stratification EXAM: Coronary Calcium Score TECHNIQUE: The patient was scanned on a [employer]. Axial non-contrast 3 mm slices were carried out through the heart. The data set was analyzed on a dedicated work station and scored using the [place]. FINDINGS: Non-cardiac: See separate report from [facility]. Ascending Aorta: Normal Caliber.  No Calcifications. Pericardium: Normal Coronary arteries: Normal coronary origins. IMPRESSION: Coronary calcium score of 73. This was 40th percentile for age and sex matched control. Fransico Him Electronically Signed: By: Fransico Him On: 07/18/2019 14:51    Assessment & Plan:   Problem List Items Addressed This Visit  Actinic keratoses    Treated by Dermatology      BPH (benign prostatic hyperplasia)    Cont on Doxazosin at night      Dyslipidemia   Relevant Orders   Comprehensive metabolic panel   TSH   Lipid panel   Pancytopenia (HCC)    F/u w/Dr Marin Olp. Monitoring CBC      Relevant Orders   Comprehensive metabolic panel   TSH   PSA   Lipid panel   Skin cancer    [facility]      Other Visit Diagnoses     Bladder neck obstruction    -  Primary   Relevant Orders   PSA         No orders of the defined types were placed in this encounter.     Follow-up: Return in about 6 months (around 08/13/2021) for a follow-up visit.  Walker Kehr, MD

## 2021-02-12 NOTE — Patient Instructions (Signed)
Richard [name]

## 2021-02-12 NOTE — Assessment & Plan Note (Signed)
Treated by Dermatology

## 2021-02-12 NOTE — Assessment & Plan Note (Signed)
F/u w/Dr Marin Olp. Monitoring CBC

## 2021-02-21 ENCOUNTER — Telehealth: Payer: Self-pay

## 2021-02-21 MED ORDER — ROSUVASTATIN CALCIUM 5 MG PO TABS: ORAL_TABLET | ORAL | 3 refills | Status: DC

## 2021-02-21 MED ORDER — DOXAZOSIN MESYLATE 4 MG PO TABS: ORAL_TABLET | ORAL | 3 refills | Status: DC

## 2021-02-21 NOTE — Telephone Encounter (Signed)
Pt wife called in stating that insurance changed and now they have to use [facility] for routine medications. Pt wife requesting Rx be sent in for: rosuvastatin (CRESTOR) 5 MG tablet doxazosin (CARDURA) 4 MG tablet

## 2021-02-21 NOTE — Telephone Encounter (Signed)
Pt is up to date sent refills to mail service.Marland KitchenJohny [name]

## 2021-04-03 ENCOUNTER — Inpatient Hospital Stay: Payer: Medicare Other | Attending: Hematology & Oncology

## 2021-04-03 ENCOUNTER — Inpatient Hospital Stay: Payer: Medicare Other | Admitting: Hematology & Oncology

## 2021-04-03 ENCOUNTER — Other Ambulatory Visit: Payer: Self-pay

## 2021-04-03 ENCOUNTER — Encounter: Payer: Self-pay | Admitting: Hematology & Oncology

## 2021-04-03 VITALS — BP 86/59 | HR 86 | Temp 98.2°F | Resp 18 | Ht 72.0 in | Wt 207.1 lb

## 2021-04-03 DIAGNOSIS — Z79899 Other long term (current) drug therapy: Secondary | ICD-10-CM | POA: Insufficient documentation

## 2021-04-03 DIAGNOSIS — Z886 Allergy status to analgesic agent status: Secondary | ICD-10-CM | POA: Insufficient documentation

## 2021-04-03 DIAGNOSIS — D61818 Other pancytopenia: Secondary | ICD-10-CM

## 2021-04-03 LAB — CMP (CANCER CENTER ONLY)
ALT: 19 U/L (ref 0–44)
AST: 15 U/L (ref 15–41)
Albumin: 4.3 g/dL (ref 3.5–5.0)
Alkaline Phosphatase: 64 U/L (ref 38–126)
Anion gap: 7 (ref 5–15)
BUN: 24 mg/dL — ABNORMAL HIGH (ref 8–23)
CO2: 27 mmol/L (ref 22–32)
Calcium: 9.2 mg/dL (ref 8.9–10.3)
Chloride: 104 mmol/L (ref 98–111)
Creatinine: 1.21 mg/dL (ref 0.61–1.24)
GFR, Estimated: >60 mL/min (ref >60)
Glucose, Bld: 113 mg/dL — ABNORMAL HIGH (ref 70–99)
Potassium: 4.3 mmol/L (ref 3.5–5.1)
Sodium: 138 mmol/L (ref 135–145)
Total Bilirubin: 0.7 mg/dL (ref 0.3–1.2)
Total Protein: 7.2 g/dL (ref 6.5–8.1)

## 2021-04-03 LAB — CBC WITH DIFFERENTIAL (CANCER CENTER ONLY)
Abs Immature Granulocytes: 0.01 10*3/uL (ref 0.00–0.07)
Basophils Absolute: 0 10*3/uL (ref 0.0–0.1)
Basophils Relative: 0 %
Eosinophils Absolute: 0.1 10*3/uL (ref 0.0–0.5)
Eosinophils Relative: 2 %
HCT: 35.4 % — ABNORMAL LOW (ref 39.0–52.0)
Hemoglobin: 12.6 g/dL — ABNORMAL LOW (ref 13.0–17.0)
Immature Granulocytes: 0 %
Lymphocytes Relative: 27 %
Lymphs Abs: 1.1 10*3/uL (ref 0.7–4.0)
MCH: 32.8 pg (ref 26.0–34.0)
MCHC: 35.6 g/dL (ref 30.0–36.0)
MCV: 92.2 fL (ref 80.0–100.0)
Monocytes Absolute: 0.4 10*3/uL (ref 0.1–1.0)
Monocytes Relative: 9 %
Neutro Abs: 2.6 10*3/uL (ref 1.7–7.7)
Neutrophils Relative %: 62 %
Platelet Count: 125 10*3/uL — ABNORMAL LOW (ref 150–400)
RBC: 3.84 MIL/uL — ABNORMAL LOW (ref 4.22–5.81)
RDW: 12 % (ref 11.5–15.5)
WBC Count: 4.1 10*3/uL (ref 4.0–10.5)
nRBC: 0 % (ref 0.0–0.2)

## 2021-04-03 LAB — RETICULOCYTES
Immature Retic Fract: 10.4 % (ref 2.3–15.9)
RBC.: 3.87 MIL/uL — ABNORMAL LOW (ref 4.22–5.81)
Retic Count, Absolute: 62.7 10*3/uL (ref 19.0–186.0)
Retic Ct Pct: 1.6 % (ref 0.4–3.1)

## 2021-04-03 LAB — LACTATE DEHYDROGENASE: LDH: 146 U/L (ref 98–192)

## 2021-04-03 LAB — SAVE SMEAR(SSMR), FOR PROVIDER SLIDE REVIEW

## 2021-04-03 NOTE — Progress Notes (Signed)
Hematology and Oncology Follow Up Visit  Richard [name] [id number] 1950/10/13 71 y.o. 04/03/2021   Principle Diagnosis:  Pancytopenia -- mild  Current Therapy:   Observation     Interim History:  Richard [name] is back for follow-up.  He comes in with his wife.  We last saw him 6 months ago.  They have been doing quite well.  With the warm weather, they have been out walking.  He has had no problems with COVID.  He has had no issues with nausea or vomiting.  He has had no bleeding.  There is been no rashes.  He has had no change in bowel or bladder habits.  He has had no leg swelling.  There is been no change in medications.  He has had no headache.  Overall, his performance status is ECOG 0.   Medications:  Current Outpatient Medications:    b complex vitamins capsule, Take 1 capsule by mouth daily., Disp: , Rfl:    Cholecalciferol (VITAMIN D) 2000 UNITS CAPS, Take by mouth daily., Disp: , Rfl:    doxazosin (CARDURA) 4 MG tablet, TAKE 1 TABLET BY MOUTH DAILY. GENERIC EQUIVALENT FOR CARDURA, Disp: 90 tablet, Rfl: 3   Flaxseed, Linseed, (FLAXSEED OIL PO), Take by mouth. Takes 1400 mg daily, Disp: , Rfl:    Multiple Vitamin (MULTIVITAMIN) tablet, Take 1 tablet by mouth daily., Disp: , Rfl:    OVER THE COUNTER MEDICATION, Lion's Mane (OTC) take 1 by mouth daily, Disp: , Rfl:    rosuvastatin (CRESTOR) 5 MG tablet, TAKE 1 TABLET(5 MG) BY MOUTH DAILY, Disp: 90 tablet, Rfl: 3   Thiamine HCl (VITAMIN B-1 PO), Take by mouth daily., Disp: , Rfl:    triamcinolone cream (KENALOG) 0.1 %, Apply 1 application topically 2 (two) times daily., Disp: 45 g, Rfl: 0   Turmeric (QC TUMERIC COMPLEX PO), Take by mouth., Disp: , Rfl:    vitamin C (ASCORBIC ACID) 500 MG tablet, Take 500 mg by mouth daily., Disp: , Rfl:   Allergies:  Allergies  Allergen Reactions   Aspirin Rash    Past Medical History, Surgical history, Social history, and Family History were reviewed and updated.  Review of  Systems: Review of Systems  Constitutional: Negative.   HENT:  Negative.    Eyes: Negative.   Respiratory: Negative.    Cardiovascular: Negative.   Gastrointestinal: Negative.   Endocrine: Negative.   Genitourinary: Negative.    Musculoskeletal: Negative.   Skin: Negative.   Neurological: Negative.   Hematological: Negative.   Psychiatric/Behavioral: Negative.     Physical Exam:  height is 6' (1.829 m) and weight is 207 lb 1.9 oz (93.9 kg). His oral temperature is 98.2 F (36.8 C). His blood pressure is 86/59 (abnormal) and his pulse is 86. His respiration is 18 and oxygen saturation is 98%.   Wt Readings from Last 3 Encounters:  04/03/21 207 lb 1.9 oz (93.9 kg)  02/12/21 208 lb 9.6 oz (94.6 kg)  10/01/20 205 lb (93 kg)    Physical Exam Vitals reviewed.  HENT:     Head: Normocephalic and atraumatic.  Eyes:     Pupils: Pupils are equal, round, and reactive to light.  Cardiovascular:     Rate and Rhythm: Normal rate and regular rhythm.     Heart sounds: Normal heart sounds.  Pulmonary:     Effort: Pulmonary effort is normal.     Breath sounds: Normal breath sounds.  Abdominal:     General: Bowel sounds are  normal.     Palpations: Abdomen is soft.  Musculoskeletal:        General: No tenderness or deformity. Normal range of motion.     Cervical back: Normal range of motion.  Lymphadenopathy:     Cervical: No cervical adenopathy.  Skin:    General: Skin is warm and dry.     Findings: No erythema or rash.  Neurological:     Mental Status: He is alert and oriented to person, place, and time.  Psychiatric:        Behavior: Behavior normal.        Thought Content: Thought content normal.        Judgment: Judgment normal.     Lab Results  Component Value Date   WBC 4.1 04/03/2021   HGB 12.6 (L) 04/03/2021   HCT 35.4 (L) 04/03/2021   MCV 92.2 04/03/2021   PLT 125 (L) 04/03/2021     Chemistry      Component Value Date/Time   NA 138 04/03/2021 0833   K 4.3  04/03/2021 0833   CL 104 04/03/2021 0833   CO2 27 04/03/2021 0833   BUN 24 (H) 04/03/2021 0833   CREATININE 1.21 04/03/2021 0833   CREATININE 1.13 10/30/2019 0850      Component Value Date/Time   CALCIUM 9.2 04/03/2021 0833   ALKPHOS 64 04/03/2021 0833   AST 15 04/03/2021 0833   ALT 19 04/03/2021 0833   BILITOT 0.7 04/03/2021 0833      Impression and Plan: Richard [name] is a very nice 71 year old white male.  He has mild pancytopenia.  In 1 year, his blood counts really have not changed at all.  I looked at his blood smear.  I do not see any immature myeloid or lymphoid forms.  There is no nucleated red blood cells.  I saw no schistocytes or spherocytes.  Platelets were well granulated.  I really think we can get her back yearly now.  I know that he and his wife are always busy.  I just do not want to interfere with her good quality of life together.  They have been married 58 years which is truly wonderful to see.    Volanda Napoleon, MD 2/16/20239:27 AM

## 2021-04-22 ENCOUNTER — Ambulatory Visit (INDEPENDENT_AMBULATORY_CARE_PROVIDER_SITE_OTHER): Payer: Medicare Other

## 2021-04-22 DIAGNOSIS — Z0000 Encounter for general adult medical examination without abnormal findings: Secondary | ICD-10-CM

## 2021-04-22 NOTE — Patient Instructions (Signed)
Richard [name] , Thank you for taking time to come for your Medicare Wellness Visit. I appreciate your ongoing commitment to your health goals. Please review the following plan we discussed and let me know if I can assist you in the future.   Screening recommendations/referrals: Colonoscopy: 11/17/2017; due every 5 years (due 11/18/2022) Recommended yearly ophthalmology/optometry visit for glaucoma screening and checkup Recommended yearly dental visit for hygiene and checkup  Vaccinations: Influenza vaccine: 10/17/2020; due every Fall  Pneumococcal vaccine: 04/16/2011, 08/06/2015; need Prevnar20 Tdap vaccine: 04/16/2011; due every 10 years (Overdue) Shingles vaccine: never done; can check with pharmacy for vaccine   Covid-19: 03/09/2019, 03/30/2019, 11/21/2019, 05/24/2020  Advanced directives: Please bring a copy of your health care power of attorney and living will to the office at your convenience.  Conditions/risks identified: Yes; Client understands the importance of follow-up appointments with providers by attending scheduled visits and discussed goals to eat healthier, increase physical activity 5 times a week for 30 minutes each, exercise the brain by doing stimulating brain exercises (reading, adult coloring, crafting, listening to music, puzzles, etc.), socialize and enjoy life more, get enough sleep at least 8-9 hours average per night and make time for laughter.  Next appointment: Please schedule your next Medicare Wellness Visit with your Nurse Health Advisor in 1 year by calling [phone].  Preventive Care 72 Years and Older, Male Preventive care refers to lifestyle choices and visits with your health care provider that can promote health and wellness. What does preventive care include? A yearly physical exam. This is also called an annual well check. Dental exams once or twice a year. Routine eye exams. Ask your health care provider how often you should have your eyes checked. Personal  lifestyle choices, including: Daily care of your teeth and gums. Regular physical activity. Eating a healthy diet. Avoiding tobacco and drug use. Limiting alcohol use. Practicing safe sex. Taking low doses of aspirin every day. Taking vitamin and mineral supplements as recommended by your health care provider. What happens during an annual well check? The services and screenings done by your health care provider during your annual well check will depend on your age, overall health, lifestyle risk factors, and family history of disease. Counseling  Your health care provider may ask you questions about your: Alcohol use. Tobacco use. Drug use. Emotional well-being. Home and relationship well-being. Sexual activity. Eating habits. History of falls. Memory and ability to understand (cognition). Work and work Statistician. Screening  You may have the following tests or measurements: Height, weight, and BMI. Blood pressure. Lipid and cholesterol levels. These may be checked every 5 years, or more frequently if you are over 43 years old. Skin check. Lung cancer screening. You may have this screening every year starting at age 44 if you have a 30-pack-year history of smoking and currently smoke or have quit within the past 15 years. Fecal occult blood test (FOBT) of the stool. You may have this test every year starting at age 66. Flexible sigmoidoscopy or colonoscopy. You may have a sigmoidoscopy every 5 years or a colonoscopy every 10 years starting at age 46. Prostate cancer screening. Recommendations will vary depending on your family history and other risks. Hepatitis C blood test. Hepatitis B blood test. Sexually transmitted disease (STD) testing. Diabetes screening. This is done by checking your blood sugar (glucose) after you have not eaten for a while (fasting). You may have this done every 1-3 years. Abdominal aortic aneurysm (AAA) screening. You may need this if you  are a  current or former smoker. Osteoporosis. You may be screened starting at age 107 if you are at high risk. Talk with your health care provider about your test results, treatment options, and if necessary, the need for more tests. Vaccines  Your health care provider may recommend certain vaccines, such as: Influenza vaccine. This is recommended every year. Tetanus, diphtheria, and acellular pertussis (Tdap, Td) vaccine. You may need a Td booster every 10 years. Zoster vaccine. You may need this after age 61. Pneumococcal 13-valent conjugate (PCV13) vaccine. One dose is recommended after age 64. Pneumococcal polysaccharide (PPSV23) vaccine. One dose is recommended after age 24. Talk to your health care provider about which screenings and vaccines you need and how often you need them. This information is not intended to replace advice given to you by your health care provider. Make sure you discuss any questions you have with your health care provider. Document Released: 03/01/2015 Document Revised: 10/23/2015 Document Reviewed: 12/04/2014 Elsevier Interactive Patient Education  2017 [place] Prevention in the Home Falls can cause injuries. They can happen to people of all ages. There are many things you can do to make your home safe and to help prevent falls. What can I do on the outside of my home? Regularly fix the edges of walkways and driveways and fix any cracks. Remove anything that might make you trip as you walk through a door, such as a raised step or threshold. Trim any bushes or trees on the path to your home. Use bright outdoor lighting. Clear any walking paths of anything that might make someone trip, such as rocks or tools. Regularly check to see if handrails are loose or broken. Make sure that both sides of any steps have handrails. Any raised decks and porches should have guardrails on the edges. Have any leaves, snow, or ice cleared regularly. Use sand or salt on  walking paths during winter. Clean up any spills in your garage right away. This includes oil or grease spills. What can I do in the bathroom? Use night lights. Install grab bars by the toilet and in the tub and shower. Do not use towel bars as grab bars. Use non-skid mats or decals in the tub or shower. If you need to sit down in the shower, use a plastic, non-slip stool. Keep the floor dry. Clean up any water that spills on the floor as soon as it happens. Remove soap buildup in the tub or shower regularly. Attach bath mats securely with double-sided non-slip rug tape. Do not have throw rugs and other things on the floor that can make you trip. What can I do in the bedroom? Use night lights. Make sure that you have a light by your bed that is easy to reach. Do not use any sheets or blankets that are too big for your bed. They should not hang down onto the floor. Have a firm chair that has side arms. You can use this for support while you get dressed. Do not have throw rugs and other things on the floor that can make you trip. What can I do in the kitchen? Clean up any spills right away. Avoid walking on wet floors. Keep items that you use a lot in easy-to-reach places. If you need to reach something above you, use a strong step stool that has a grab bar. Keep electrical cords out of the way. Do not use floor polish or wax that makes floors slippery. If you  must use wax, use non-skid floor wax. Do not have throw rugs and other things on the floor that can make you trip. What can I do with my stairs? Do not leave any items on the stairs. Make sure that there are handrails on both sides of the stairs and use them. Fix handrails that are broken or loose. Make sure that handrails are as long as the stairways. Check any carpeting to make sure that it is firmly attached to the stairs. Fix any carpet that is loose or worn. Avoid having throw rugs at the top or bottom of the stairs. If you do  have throw rugs, attach them to the floor with carpet tape. Make sure that you have a light switch at the top of the stairs and the bottom of the stairs. If you do not have them, ask someone to add them for you. What else can I do to help prevent falls? Wear shoes that: Do not have high heels. Have rubber bottoms. Are comfortable and fit you well. Are closed at the toe. Do not wear sandals. If you use a stepladder: Make sure that it is fully opened. Do not climb a closed stepladder. Make sure that both sides of the stepladder are locked into place. Ask someone to hold it for you, if possible. Clearly mark and make sure that you can see: Any grab bars or handrails. First and last steps. Where the edge of each step is. Use tools that help you move around (mobility aids) if they are needed. These include: Canes. Walkers. Scooters. Crutches. Turn on the lights when you go into a dark area. Replace any light bulbs as soon as they burn out. Set up your furniture so you have a clear path. Avoid moving your furniture around. If any of your floors are uneven, fix them. If there are any pets around you, be aware of where they are. Review your medicines with your doctor. Some medicines can make you feel dizzy. This can increase your chance of falling. Ask your doctor what other things that you can do to help prevent falls. This information is not intended to replace advice given to you by your health care provider. Make sure you discuss any questions you have with your health care provider. Document Released: 11/29/2008 Document Revised: 07/11/2015 Document Reviewed: 03/09/2014 Elsevier Interactive Patient Education  2017 [employer].

## 2021-04-22 NOTE — Progress Notes (Cosign Needed Addendum)
I connected with Richard [name] today by telephone and verified that I am speaking with the correct person using two identifiers. Location patient: home Location provider: work Persons participating in the virtual visit: patient, provider.   I discussed the limitations, risks, security and privacy concerns of performing an evaluation and management service by telephone and the availability of in person appointments. I also discussed with the patient that there may be a patient responsible charge related to this service. The patient expressed understanding and verbally consented to this telephonic visit.    Interactive audio and video telecommunications were attempted between this provider and patient, however failed, due to patient having technical difficulties OR patient did not have access to video capability.  We continued and completed visit with audio only.  Some vital signs may be absent or patient reported.   Time Spent with patient on telephone encounter: 40 minutes  Subjective:   Richard [name] is a 71 y.o. male who presents for Medicare Annual/Subsequent preventive examination.  Review of Systems     Cardiac Risk Factors include: advanced age (>62mn, >>72women);dyslipidemia;male gender     Objective:    Today's Vitals   04/22/21 1425  PainSc: 0-No pain   There is no height or weight on file to calculate BMI.  Advanced Directives 04/22/2021 04/03/2021 10/01/2020 04/19/2020 11/29/2019 02/14/2016  Does Patient Have a Medical Advance Directive? Yes No No Yes No No  Type of Advance Directive Living will;Healthcare Power of Attorney - - - - -  Does patient want to make changes to medical advance directive? No - Patient declined - - No - Patient declined - -  [place]? No - copy requested - - - - -  Would patient like information on creating a medical advance directive? No - Patient declined No - Patient declined No - Patient declined - No -  Patient declined -    Current Medications (verified) Outpatient Encounter Medications as of 04/22/2021  Medication Sig   b complex vitamins capsule Take 1 capsule by mouth daily.   Cholecalciferol (VITAMIN D) 2000 UNITS CAPS Take by mouth daily.   doxazosin (CARDURA) 4 MG tablet TAKE 1 TABLET BY MOUTH DAILY. GENERIC EQUIVALENT FOR CARDURA   Flaxseed, Linseed, (FLAXSEED OIL PO) Take by mouth. Takes 1400 mg daily   Multiple Vitamin (MULTIVITAMIN) tablet Take 1 tablet by mouth daily.   OVER THE COUNTER MEDICATION [place] (OTC) take 1 by mouth daily   rosuvastatin (CRESTOR) 5 MG tablet TAKE 1 TABLET(5 MG) BY MOUTH DAILY   Thiamine HCl (VITAMIN B-1 PO) Take by mouth daily.   triamcinolone cream (KENALOG) 0.1 % Apply 1 application topically 2 (two) times daily.   Turmeric (QC TUMERIC COMPLEX PO) Take by mouth.   vitamin C (ASCORBIC ACID) 500 MG tablet Take 500 mg by mouth daily.   No facility-administered encounter medications on file as of 04/22/2021.    Allergies (verified) Aspirin   History: Past Medical History:  Diagnosis Date   BPH (benign prostatic hyperplasia)    Hypertension    Hypogonadism male    Mononucleosis    Mumps    Plantar fasciitis    PONV (postoperative nausea and vomiting)    Zoster    Past Surgical History:  Procedure Laterality Date   circumsicion  2007   asa adult for balanitis w/ phimosis   COLONOSCOPY  2013   HERNIA REPAIR Left    INGUINAL HERNIA REPAIR  childhood  left   LIPOMA EXCISION N/A 02/19/2016   Procedure: EXCISION SUBCUTANEOUS LIPOMA BACK;  Surgeon: Donnie Mesa, MD;  Location: [facility];  Service: General;  Laterality: N/A;   MOUTH SURGERY     early eruption of incisors requiring surgical extraction   POLYPECTOMY     [place]   Family History  Problem Relation Age of Onset   Arthritis Mother        spinal stenosis s/p surgery   Diabetes Neg Hx    Heart disease Neg Hx     Colon cancer Neg Hx    Stomach cancer Neg Hx    Colon polyps Neg Hx    Rectal cancer Neg Hx    Social History   Socioeconomic History   Marital status: Married    Spouse name: Not on file   Number of children: 3   Years of education: 16   Highest education level: Not on file  Occupational History   Occupation: sales  Tobacco Use   Smoking status: Former    Types: Cigarettes    Quit date: 04/16/1967    Years since quitting: 54.0   Smokeless tobacco: Never  Vaping Use   Vaping Use: Never used  Substance and Sexual Activity   Alcohol use: No   Drug use: No   Sexual activity: Yes    Partners: Female  Other Topics Concern   Not on file  Social History Narrative   HSG, [place] - [place]. Married '75. 2 sons - '71(adopted), '76; 1 dtr (adopted) '73; 4 grandchildren.   Work - Actuary, currently Garment/textile technologist. Marriage is in good health. ACP - HCPOA - wife and son; yes - CPR; yes for acute care; no prolonged futile or heroic measures.      JEHOVAH'S WITNESS - OK for cell salvage, volume expanded, immunoglobulins. Solid organ transplant - ????   Social Determinants of Health   Financial Resource Strain: Low Risk    Difficulty of Paying Living Expenses: Not hard at all  Food Insecurity: No Food Insecurity   Worried About Charity fundraiser in the Last Year: Never true   [place] in the Last Year: Never true  Transportation Needs: No Transportation Needs   Lack of Transportation (Medical): No   Lack of Transportation (Non-Medical): No  Physical Activity: Sufficiently Active   Days of Exercise per Week: 5 days   Minutes of Exercise per Session: 30 min  Stress: No Stress Concern Present   Feeling of Stress : Not at all  Social Connections: Socially Integrated   Frequency of Communication with Friends and Family: More than three times a week   Frequency of Social Gatherings with Friends and Family: More than three times a week   Attends Religious Services: More  than 4 times per year   Active Member of [employer] or Organizations: Yes   Attends Music therapist: More than 4 times per year   Marital Status: Married    Tobacco Counseling Counseling given: Not Answered   Clinical Intake:  Pre-visit preparation completed: Yes  Pain : No/denies pain Pain Score: 0-No pain     Nutritional Risks: None Diabetes: No  How often do you need to have someone help you when you read instructions, pamphlets, or other written materials from your doctor or pharmacy?: 1 - Never What is the last grade level you completed in school?: Bachelor's Degree  Diabetic? no  Interpreter Needed?: No  Information entered by :: Lisette Abu, LPN   Activities of Daily Living In your present state of health, do you have any difficulty performing the following activities: 04/22/2021  Hearing? N  Vision? N  Difficulty concentrating or making decisions? N  Walking or climbing stairs? N  Dressing or bathing? N  Doing errands, shopping? N  Preparing Food and eating ? N  Using the Toilet? N  In the past six months, have you accidently leaked urine? N  Do you have problems with loss of bowel control? N  Managing your Medications? N  Managing your Finances? N  Housekeeping or managing your Housekeeping? N  Some recent data might be hidden    Patient Care Team: Plotnikov, Evie Lacks, MD as PCP - General (Internal Medicine) Warren Danes, PA-C as Physician Assistant (Dermatology) Marygrace Drought, MD as Consulting Physician (Ophthalmology)  Indicate any recent Medical Services you may have received from other than [facility] providers in the past year (date may be approximate).     Assessment:   This is a routine wellness examination for [facility].  Hearing/Vision screen Hearing Screening - Comments:: Patient denied any hearing difficulty.   No hearing aids.  Vision Screening - Comments:: Patient does wear corrective lenses/contacts.   Eye exam done  by: Marygrace Drought, MD.  Dietary issues and exercise activities discussed: Current Exercise Habits: Home exercise routine, Type of exercise: walking, Time (Minutes): 30, Frequency (Times/Week): 5, Weekly Exercise (Minutes/Week): 150, Intensity: Moderate   Goals Addressed             This Visit's Progress    Patient Stated       My goal is to maintain good health.      Depression Screen PHQ 2/9 Scores 04/22/2021 04/19/2020 10/30/2019 10/25/2017 10/23/2016 08/06/2015  PHQ - 2 Score 0 0 0 0 0 0    Fall Risk Fall Risk  04/22/2021 04/04/2020 01/10/2019 10/25/2017 10/23/2016  Falls in the past year? 0 1 0 No No  Comment - - Emmi Telephone Survey: data to providers prior to load - -  Number falls in past yr: 0 0 - - -  Injury with Fall? 0 0 - - -  Risk for fall due to : No Fall Risks No Fall Risks - - -  Follow up Falls evaluation completed - - - -    FALL RISK PREVENTION PERTAINING TO THE HOME:  Any stairs in or around the home? No  If so, are there any without handrails? No  Home free of loose throw rugs in walkways, pet beds, electrical cords, etc? Yes  Adequate lighting in your home to reduce risk of falls? Yes   ASSISTIVE DEVICES UTILIZED TO PREVENT FALLS:  Life alert? No  Use of a cane, walker or w/c? No  Grab bars in the bathroom? No  Shower chair or bench in shower? Yes  Elevated toilet seat or a handicapped toilet? Yes   TIMED UP AND GO:  Was the test performed? No .  Length of time to ambulate 10 feet: n/a sec.   Gait steady and fast without use of assistive device  Cognitive Function: Normal cognitive status assessed by direct observation by this Nurse Health Advisor. No abnormalities found.          Immunizations Immunization History  Administered Date(s) Administered   Fluad Quad(high Dose 65+) 10/21/2018, 10/21/2018, 10/18/2019   Influenza Whole 12/16/2007, 11/13/2008, 01/18/2012   Influenza, High Dose Seasonal PF 10/18/2015, 10/25/2017  Influenza,inj,Quad  PF,6+ Mos 11/14/2014, 10/23/2016   PFIZER(Purple Top)SARS-COV-2 Vaccination 03/09/2019, 03/30/2019, 11/21/2019, 05/24/2020   Pneumococcal Conjugate-13 08/06/2015   Pneumococcal Polysaccharide-23 04/16/2011   Tdap 04/16/2011   Zoster, Live 09/26/2011    TDAP status: Due, Education has been provided regarding the importance of this vaccine. Advised may receive this vaccine at local pharmacy or Health Dept. Aware to provide a copy of the vaccination record if obtained from local pharmacy or Health Dept. Verbalized acceptance and understanding.  Flu Vaccine status: Up to date  Pneumococcal vaccine status: Due, Education has been provided regarding the importance of this vaccine. Advised may receive this vaccine at local pharmacy or Health Dept. Aware to provide a copy of the vaccination record if obtained from local pharmacy or Health Dept. Verbalized acceptance and understanding.  Covid-19 vaccine status: Completed vaccines  Qualifies for Shingles Vaccine? Yes   Zostavax completed Yes   Shingrix Completed?: No.    Education has been provided regarding the importance of this vaccine. Patient has been advised to call insurance company to determine out of pocket expense if they have not yet received this vaccine. Advised may also receive vaccine at local pharmacy or Health Dept. Verbalized acceptance and understanding.  Screening Tests Health Maintenance  Topic Date Due   Zoster Vaccines- Shingrix (1 of 2) Never done   Pneumonia Vaccine 26+ Years old (64) 08/05/2016   COVID-19 Vaccine (5 - Booster for Pfizer series) 07/19/2020   TETANUS/TDAP  04/16/2021   COLONOSCOPY (Pts 45-39yr Insurance coverage will need to be confirmed)  11/18/2022   INFLUENZA VACCINE  Completed   Hepatitis C Screening  Completed   HPV VACCINES  Aged Out    Health Maintenance  Health Maintenance Due  Topic Date Due   Zoster Vaccines- Shingrix (1 of 2) Never done   Pneumonia Vaccine 72 Years old (366 08/05/2016    COVID-19 Vaccine (5 - Booster for Pfizer series) 07/19/2020   TETANUS/TDAP  04/16/2021    Colorectal cancer screening: Type of screening: Colonoscopy. Completed 11/17/2017. Repeat every 5 years  Lung Cancer Screening: (Low Dose CT Chest recommended if Age 72-80years, 30 pack-year currently smoking OR have quit w/in 15years.) does not qualify.   Lung Cancer Screening Referral: no  Additional Screening:  Hepatitis C Screening: does qualify; Completed yes  Vision Screening: Recommended annual ophthalmology exams for early detection of glaucoma and other disorders of the eye. Is the patient up to date with their annual eye exam?  Yes  Who is the provider or what is the name of the office in which the patient attends annual eye exams? [facility] If pt is not established with a provider, would they like to be referred to a provider to establish care? No .   Dental Screening: Recommended annual dental exams for proper oral hygiene  Community Resource Referral / Chronic Care Management: CRR required this visit?  No   CCM required this visit?  No      Plan:     I have personally reviewed and noted the following in the patients chart:   Medical and social history Use of alcohol, tobacco or illicit drugs  Current medications and supplements including opioid prescriptions. Patient is not currently taking opioid prescriptions. Functional ability and status Nutritional status Physical activity Advanced directives List of other physicians Hospitalizations, surgeries, and ER visits in previous 12 months Vitals Screenings to include cognitive, depression, and falls Referrals and appointments  In addition, I have  reviewed and discussed with patient certain preventive protocols, quality metrics, and best practice recommendations. A written personalized care plan for preventive services as well as general preventive health  recommendations were provided to patient.     Sheral Flow, LPN   02/17/1622   Nurse Notes:  Patient is cogitatively intact. There were no vitals filed for this visit. There is no height or weight on file to calculate BMI.  Medical screening examination/treatment/procedure(s) were performed by non-physician practitioner and as supervising physician I was immediately available for consultation/collaboration.  I agree with above. Lew Dawes, MD

## 2021-04-23 ENCOUNTER — Ambulatory Visit: Payer: Medicare Other | Admitting: Physician Assistant

## 2021-04-23 ENCOUNTER — Other Ambulatory Visit: Payer: Self-pay

## 2021-04-23 ENCOUNTER — Encounter: Payer: Self-pay | Admitting: Physician Assistant

## 2021-04-23 DIAGNOSIS — Z1283 Encounter for screening for malignant neoplasm of skin: Secondary | ICD-10-CM | POA: Diagnosis not present

## 2021-04-23 DIAGNOSIS — L570 Actinic keratosis: Secondary | ICD-10-CM | POA: Diagnosis not present

## 2021-04-23 DIAGNOSIS — Z85828 Personal history of other malignant neoplasm of skin: Secondary | ICD-10-CM

## 2021-04-23 MED ORDER — TOLAK 4 % EX CREA: 1.0000 "application " | TOPICAL_CREAM | Freq: Every evening | CUTANEOUS | 0 refills | Status: AC

## 2021-04-23 NOTE — Progress Notes (Signed)
? ?  Follow-Up Visit ?  ?Subjective  ?Richard [name] is a 71 y.o. male who presents for the following: Annual Exam (No new concerns. History of bcc ans scc with mohs surgery. No family history of melanoma. ) and Cyst. ? ? ?The following portions of the chart were reviewed this encounter and updated as appropriate:  Tobacco  Allergies  Meds  Problems  Med Hx  Surg Hx  Fam Hx   ?  ? ?Objective  ?Well appearing patient in no apparent distress; mood and affect are within normal limits. ? ?A full examination was performed including scalp, head, eyes, ears, nose, lips, neck, chest, axillae, abdomen, back, buttocks, bilateral upper extremities, bilateral lower extremities, hands, feet, fingers, toes, fingernails, and toenails. All findings within normal limits unless otherwise noted below. ? ?head to toe ?No atypical nevi or signs of NMSC noted at the time of the visit.  ? ?Scalp ?Erythematous patches with gritty scale. ? ? ?Assessment & Plan  ?Screening exam for skin cancer ?head to toe ? ?Yearly skin examinations.  ? ?AK (actinic keratosis) ?Scalp ? ?Fluorouracil (TOLAK) 4 % CREA - Scalp ?Apply 1 application. topically at bedtime. Apply topically nightly for 2 weeks ? ?No atypical nevi noted at the time of the visit. ? ?I, Ciaira Natividad, PA-C, have reviewed all documentation's for this visit.  The documentation on 04/23/21 for the exam, diagnosis, procedures and orders are all accurate and complete. ?

## 2021-04-29 DIAGNOSIS — H5203 Hypermetropia, bilateral: Secondary | ICD-10-CM | POA: Diagnosis not present

## 2021-04-29 DIAGNOSIS — H2513 Age-related nuclear cataract, bilateral: Secondary | ICD-10-CM | POA: Diagnosis not present

## 2021-04-29 DIAGNOSIS — H04123 Dry eye syndrome of bilateral lacrimal glands: Secondary | ICD-10-CM | POA: Diagnosis not present

## 2021-06-25 ENCOUNTER — Encounter: Payer: Self-pay | Admitting: Family Medicine

## 2021-06-25 ENCOUNTER — Ambulatory Visit (INDEPENDENT_AMBULATORY_CARE_PROVIDER_SITE_OTHER): Payer: Medicare Other

## 2021-06-25 ENCOUNTER — Ambulatory Visit (INDEPENDENT_AMBULATORY_CARE_PROVIDER_SITE_OTHER): Payer: Medicare Other | Admitting: Family Medicine

## 2021-06-25 VITALS — BP 110/62 | HR 70 | Temp 97.7°F | Ht 72.0 in | Wt 209.0 lb

## 2021-06-25 DIAGNOSIS — S62634A Displaced fracture of distal phalanx of right ring finger, initial encounter for closed fracture: Secondary | ICD-10-CM | POA: Diagnosis not present

## 2021-06-25 DIAGNOSIS — S67194A Crushing injury of right ring finger, initial encounter: Secondary | ICD-10-CM | POA: Insufficient documentation

## 2021-06-25 DIAGNOSIS — S6000XA Contusion of unspecified finger without damage to nail, initial encounter: Secondary | ICD-10-CM

## 2021-06-25 NOTE — Patient Instructions (Signed)
I am referring you to a hand specialist.  ?

## 2021-06-25 NOTE — Progress Notes (Signed)
? ?Subjective:  ? ? ? Patient ID: Richard [name], male    DOB: 1950-03-03, 71 y.o.   MRN: [id number] ? ?Chief Complaint  ?Patient presents with  ? Hand Pain  ?  Right ring finger was smashed by a safe door on 5/4. Pain originally was a 15/10 but as time goes on it has gone down to 8 and is now a 1 unless bumped into something. Yesterday when he took bandage off it was still bleeding and was concerned so wanted it looked at.  ? ? ?HPI ?Patient is in today for a crush injury of his right ring finger 6 days ago. He is right handed. Slammed his finger by a heavy door. States he has been soaking his finger and took pain medication for the first couple of days. No fever, chills, N/V/D.  ? ?He has needed any pain medication for past 3 days.  ? ?Health Maintenance Due  ?Topic Date Due  ? Zoster Vaccines- Shingrix (1 of 2) Never done  ? Pneumonia Vaccine 68+ Years old (34) 08/05/2016  ? TETANUS/TDAP  04/16/2021  ? ? ?Past Medical History:  ?Diagnosis Date  ? Basal cell carcinoma 10/24/2020  ? left ant neck bcc mohs  ? BPH (benign prostatic hyperplasia)   ? Hypertension   ? Hypogonadism male   ? Mononucleosis   ? Mumps   ? Plantar fasciitis   ? PONV (postoperative nausea and vomiting)   ? Squamous cell carcinoma of skin 10/24/2020  ? mod forehead and right temple  ? Zoster   ? ? ?Past Surgical History:  ?Procedure Laterality Date  ? circumsicion  2007  ? asa adult for balanitis w/ phimosis  ? COLONOSCOPY  2013  ? HERNIA REPAIR Left   ? INGUINAL HERNIA REPAIR  childhood  ? left  ? LIPOMA EXCISION N/A 02/19/2016  ? Procedure: EXCISION SUBCUTANEOUS LIPOMA BACK;  Surgeon: Donnie Mesa, MD;  Location: [facility];  Service: General;  Laterality: N/A;  ? MOUTH SURGERY    ? early eruption of incisors requiring surgical extraction  ? POLYPECTOMY    ? TONSILLECTOMY  1959  ? [place] EXTRACTION  1990  ? ? ?Family History  ?Problem Relation Age of Onset  ? Arthritis Mother   ?     spinal stenosis s/p surgery  ?  Diabetes Neg Hx   ? Heart disease Neg Hx   ? Colon cancer Neg Hx   ? Stomach cancer Neg Hx   ? Colon polyps Neg Hx   ? Rectal cancer Neg Hx   ? ? ?Social History  ? ?Socioeconomic History  ? Marital status: Married  ?  Spouse name: Not on file  ? Number of children: 3  ? Years of education: 30  ? Highest education level: Not on file  ?Occupational History  ? Occupation: Press photographer  ?Tobacco Use  ? Smoking status: Former  ?  Types: Cigarettes  ?  Quit date: 04/16/1967  ?  Years since quitting: 54.2  ? Smokeless tobacco: Never  ?Vaping Use  ? Vaping Use: Never used  ?Substance and Sexual Activity  ? Alcohol use: No  ? Drug use: No  ? Sexual activity: Yes  ?  Partners: Female  ?Other Topics Concern  ? Not on file  ?Social History Narrative  ? [facility], [facility] [employer] [facility] [place]. Married '75. 2 sons - '71(adopted), '76; 1 dtr (adopted) '73; 4 grandchildren.  ? Work - Actuary, currently Garment/textile technologist. Marriage is in good  health. ACP - HCPOA - wife and son; yes - CPR; yes for acute care; no prolonged futile or heroic measures.  ?   ? JEHOVAH'S WITNESS - OK for cell salvage, volume expanded, immunoglobulins. Solid organ transplant - ????  ? ?Social Determinants of Health  ? ?Financial Resource Strain: Low Risk   ? Difficulty of Paying Living Expenses: Not hard at all  ?Food Insecurity: No Food Insecurity  ? Worried About Charity fundraiser in the Last Year: Never true  ? Ran Out of Food in the Last Year: Never true  ?Transportation Needs: No Transportation Needs  ? Lack of Transportation (Medical): No  ? Lack of Transportation (Non-Medical): No  ?Physical Activity: Sufficiently Active  ? Days of Exercise per Week: 5 days  ? Minutes of Exercise per Session: 30 min  ?Stress: No Stress Concern Present  ? Feeling of Stress : Not at all  ?Social Connections: Socially Integrated  ? Frequency of Communication with Friends and Family: More than three times a week  ? Frequency of Social Gatherings with Friends and Family: More than three  times a week  ? Attends Religious Services: More than 4 times per year  ? Active Member of Clubs or Organizations: Yes  ? Attends Archivist Meetings: More than 4 times per year  ? Marital Status: Married  ?Intimate Partner Violence: Not At Risk  ? Fear of Current or Ex-Partner: No  ? Emotionally Abused: No  ? Physically Abused: No  ? Sexually Abused: No  ? ? ?Outpatient Medications Prior to Visit  ?Medication Sig Dispense Refill  ? b complex vitamins capsule Take 1 capsule by mouth daily.    ? Cholecalciferol (VITAMIN D) 2000 UNITS CAPS Take by mouth daily.    ? doxazosin (CARDURA) 4 MG tablet TAKE 1 TABLET BY MOUTH DAILY. GENERIC EQUIVALENT FOR CARDURA 90 tablet 3  ? Flaxseed, Linseed, (FLAXSEED OIL PO) Take by mouth. Takes 1400 mg daily    ? Fluorouracil (TOLAK) 4 % CREA Apply 1 application. topically at bedtime. Apply topically nightly for 2 weeks 40 g 0  ? Multiple Vitamin (MULTIVITAMIN) tablet Take 1 tablet by mouth daily.    ? OVER THE COUNTER MEDICATION Lion's Mane (OTC) take 1 by mouth daily    ? rosuvastatin (CRESTOR) 5 MG tablet TAKE 1 TABLET(5 MG) BY MOUTH DAILY 90 tablet 3  ? Thiamine HCl (VITAMIN B-1 PO) Take by mouth daily.    ? triamcinolone cream (KENALOG) 0.1 % Apply 1 application topically 2 (two) times daily. 45 g 0  ? Turmeric (QC TUMERIC COMPLEX PO) Take by mouth.    ? vitamin C (ASCORBIC ACID) 500 MG tablet Take 500 mg by mouth daily.    ? ?No facility-administered medications prior to visit.  ? ? ?Allergies  ?Allergen Reactions  ? Aspirin Rash  ? ? ?ROS ?Pertinent positives and negatives in the history of present illness. ? ?   ?Objective:  ?  ?Physical Exam ? ?BP 110/62 (BP Location: Left Arm, Patient Position: Sitting, Cuff Size: Large)   Pulse 70   Temp 97.7 ?F (36.5 ?C) (Temporal)   Ht 6' (1.829 m)   Wt 209 lb (94.8 kg)   SpO2 98%   BMI 28.35 kg/m?  ?Wt Readings from Last 3 Encounters:  ?06/25/21 209 lb (94.8 kg)  ?04/03/21 207 lb 1.9 oz (93.9 kg)  ?02/12/21 208 lb 9.6 oz  (94.6 kg)  ? ?No sign of infection and finger is neurovascularly intact. Large hematoma  noted and TTP to the DIP joint.  ? ? ? ?   ?Assessment & Plan:  ? ?Problem List Items Addressed This Visit   ? ?  ? Other  ? Crushing injury of right ring finger - Primary  ? Relevant Orders  ? Ambulatory referral to Hand Surgery  ? DG Finger Ring Right  ? Traumatic hematoma of finger  ? ?No sign of infection or tendon rupture. X ray ordered. Urgent referral to hand specialist and he will be seen later today.  ? ?I am having Richard Bells A. Beaulac "[name]" maintain his multivitamin, Vitamin D, Thiamine HCl (VITAMIN B-1 PO), vitamin C, (Flaxseed, Linseed, (FLAXSEED OIL PO)), triamcinolone cream, Turmeric (QC TUMERIC COMPLEX PO), b complex vitamins, OVER THE COUNTER MEDICATION, doxazosin, rosuvastatin, and Tolak. ? ?No orders of the defined types were placed in this encounter. ? ? ?

## 2021-06-26 ENCOUNTER — Ambulatory Visit: Payer: Medicare Other | Admitting: Orthopedic Surgery

## 2021-07-07 DIAGNOSIS — S62634D Displaced fracture of distal phalanx of right ring finger, subsequent encounter for fracture with routine healing: Secondary | ICD-10-CM | POA: Diagnosis not present

## 2021-08-18 ENCOUNTER — Ambulatory Visit (INDEPENDENT_AMBULATORY_CARE_PROVIDER_SITE_OTHER): Payer: Medicare Other | Admitting: Internal Medicine

## 2021-08-18 ENCOUNTER — Encounter: Payer: Self-pay | Admitting: Internal Medicine

## 2021-08-18 VITALS — BP 110/70 | HR 67 | Temp 98.1°F | Ht 72.0 in | Wt 209.0 lb

## 2021-08-18 DIAGNOSIS — E785 Hyperlipidemia, unspecified: Secondary | ICD-10-CM

## 2021-08-18 DIAGNOSIS — M25562 Pain in left knee: Secondary | ICD-10-CM | POA: Diagnosis not present

## 2021-08-18 DIAGNOSIS — Z23 Encounter for immunization: Secondary | ICD-10-CM | POA: Diagnosis not present

## 2021-08-18 DIAGNOSIS — M5410 Radiculopathy, site unspecified: Secondary | ICD-10-CM | POA: Diagnosis not present

## 2021-08-18 DIAGNOSIS — G8929 Other chronic pain: Secondary | ICD-10-CM | POA: Diagnosis not present

## 2021-08-18 MED ORDER — METHYLPREDNISOLONE 4 MG PO TBPK: ORAL_TABLET | ORAL | 0 refills | Status: DC

## 2021-08-18 NOTE — Assessment & Plan Note (Addendum)
R LS 07/2021 w/RLE weakness

## 2021-08-18 NOTE — Patient Instructions (Addendum)
Radius root slayer shovel Blue-Emu cream --  use 2-3 times a day

## 2021-08-18 NOTE — Progress Notes (Signed)
Subjective:  Patient ID: Richard [name], male    DOB: [birth year]  Age: 71 y.o. MRN: [id number]  CC: No chief complaint on file.   HPI [name] presents for pain in LS spine on the R x years off and on. Now the pain is constant x 2 months 2-8/10. R LE would get weak. No paresthesia.  C/o L inner knee pain  Outpatient Medications Prior to Visit  Medication Sig Dispense Refill   b complex vitamins capsule Take 1 capsule by mouth daily.     Cholecalciferol (VITAMIN D) 2000 UNITS CAPS Take by mouth daily.     doxazosin (CARDURA) 4 MG tablet TAKE 1 TABLET BY MOUTH DAILY. GENERIC EQUIVALENT FOR CARDURA 90 tablet 3   Flaxseed, Linseed, (FLAXSEED OIL PO) Take by mouth. Takes 1400 mg daily     Fluorouracil (TOLAK) 4 % CREA Apply 1 application. topically at bedtime. Apply topically nightly for 2 weeks 40 g 0   Multiple Vitamin (MULTIVITAMIN) tablet Take 1 tablet by mouth daily.     OVER THE COUNTER MEDICATION Lion's Mane (OTC) take 1 by mouth daily     rosuvastatin (CRESTOR) 5 MG tablet TAKE 1 TABLET(5 MG) BY MOUTH DAILY 90 tablet 3   Thiamine HCl (VITAMIN B-1 PO) Take by mouth daily.     triamcinolone cream (KENALOG) 0.1 % Apply 1 application topically 2 (two) times daily. 45 g 0   Turmeric (QC TUMERIC COMPLEX PO) Take by mouth.     vitamin C (ASCORBIC ACID) 500 MG tablet Take 500 mg by mouth daily.     No facility-administered medications prior to visit.    ROS: Review of Systems  Constitutional:  Negative for appetite change, fatigue and unexpected weight change.  HENT:  Negative for congestion, nosebleeds, sneezing, sore throat and trouble swallowing.   Eyes:  Negative for itching and visual disturbance.  Respiratory:  Negative for cough.   Cardiovascular:  Negative for chest pain, palpitations and leg swelling.  Gastrointestinal:  Negative for abdominal distention, blood in stool, diarrhea and nausea.  Genitourinary:  Negative for frequency and hematuria.   Musculoskeletal:  Positive for arthralgias and back pain. Negative for gait problem, joint swelling and neck pain.  Skin:  Negative for rash.  Neurological:  Negative for dizziness, tremors, speech difficulty and weakness.  Psychiatric/Behavioral:  Negative for agitation, dysphoric mood and sleep disturbance. The patient is not nervous/anxious.     Objective:  BP 110/70 (BP Location: Left Arm, Patient Position: Sitting, Cuff Size: Normal)   Pulse 67   Temp 98.1 F (36.7 C) (Oral)   Ht 6' (1.829 m)   Wt 209 lb (94.8 kg)   SpO2 97%   BMI 28.35 kg/m   BP Readings from Last 3 Encounters:  08/18/21 110/70  06/25/21 110/62  04/03/21 (!) 86/59    Wt Readings from Last 3 Encounters:  08/18/21 209 lb (94.8 kg)  06/25/21 209 lb (94.8 kg)  04/03/21 207 lb 1.9 oz (93.9 kg)    Physical Exam Constitutional:      General: He is not in acute distress.    Appearance: Normal appearance. He is well-developed.     Comments: NAD  Eyes:     Conjunctiva/sclera: Conjunctivae normal.     Pupils: Pupils are equal, round, and reactive to light.  Neck:     Thyroid: No thyromegaly.     Vascular: No JVD.  Cardiovascular:     Rate and Rhythm: Normal rate and regular rhythm.  Heart sounds: Normal heart sounds. No murmur heard.    No friction rub. No gallop.  Pulmonary:     Effort: Pulmonary effort is normal. No respiratory distress.     Breath sounds: Normal breath sounds. No wheezing or rales.  Chest:     Chest wall: No tenderness.  Abdominal:     General: Bowel sounds are normal. There is no distension.     Palpations: Abdomen is soft. There is no mass.     Tenderness: There is no abdominal tenderness. There is no guarding or rebound.  Musculoskeletal:        General: Tenderness present. No deformity. Normal range of motion.     Cervical back: Normal range of motion.  Lymphadenopathy:     Cervical: No cervical adenopathy.  Skin:    General: Skin is warm and dry.     Findings: No  rash.  Neurological:     Mental Status: He is alert and oriented to person, place, and time.     Cranial Nerves: No cranial nerve deficit.     Motor: No abnormal muscle tone.     Coordination: Coordination normal.     Gait: Gait normal.     Deep Tendon Reflexes: Reflexes are normal and symmetric.  Psychiatric:        Behavior: Behavior normal.        Thought Content: Thought content normal.        Judgment: Judgment normal.    LS w/pain MS 5-/5 RLE; DTR a little suppressed on the R  L inner knee w/pain Lab Results  Component Value Date   WBC 4.1 04/03/2021   HGB 12.6 (L) 04/03/2021   HCT 35.4 (L) 04/03/2021   PLT 125 (L) 04/03/2021   GLUCOSE 113 (H) 04/03/2021   CHOL 128 02/12/2021   TRIG 182.0 (H) 02/12/2021   HDL 37.60 (L) 02/12/2021   LDLDIRECT 80.9 04/16/2011   LDLCALC 54 02/12/2021   ALT 19 04/03/2021   AST 15 04/03/2021   NA 138 04/03/2021   K 4.3 04/03/2021   CL 104 04/03/2021   CREATININE 1.21 04/03/2021   BUN 24 (H) 04/03/2021   CO2 27 04/03/2021   TSH 3.15 02/12/2021   PSA 1.10 02/12/2021   HGBA1C 5.1 10/27/2018    CT CARDIAC SCORING  Addendum Date: 07/18/2019   ADDENDUM REPORT: 07/18/2019 15:15 EXAM: OVER-READ INTERPRETATION  CT CHEST The following report is an over-read performed by radiologist Dr. Rebekah Chesterfield [facility] on 07/18/2019. This over-read does not include interpretation of cardiac or coronary anatomy or pathology. The coronary calcium score interpretation by the cardiologist is attached. COMPARISON:  None. FINDINGS: Calcified granuloma in the right lower lobe. Within the visualized portions of the thorax there are no other suspicious appearing pulmonary nodules or masses, there is no acute consolidative airspace disease, no pleural effusions, no pneumothorax and no lymphadenopathy. Visualized portions of the upper abdomen are unremarkable. There are no aggressive appearing lytic or blastic lesions noted in the visualized portions  of the skeleton. IMPRESSION: 1. Calcified granuloma in the right lower lobe incidentally noted. No other significant incidental noncardiac findings are noted. Electronically Signed   By: Vinnie Langton M.D.   On: 07/18/2019 15:15   Result Date: 07/18/2019 CLINICAL DATA:  Risk stratification EXAM: Coronary Calcium Score TECHNIQUE: The patient was scanned on a [employer]. Axial non-contrast 3 mm slices were carried out through the heart. The data set was analyzed on a dedicated work station and scored using  the Agatson method. FINDINGS: Non-cardiac: See separate report from [facility]. Ascending Aorta: Normal Caliber.  No Calcifications. Pericardium: Normal Coronary arteries: Normal coronary origins. IMPRESSION: Coronary calcium score of 73. This was 40th percentile for age and sex matched control. Fransico Him Electronically Signed: By: Fransico Him On: 07/18/2019 14:51    Assessment & Plan:   Problem List Items Addressed This Visit     Dyslipidemia    Hold Crestor x 2 weeks      Knee pain, chronic    L inner meniscus injury? Blue-Emu cream was recommended to use 2-3 times a day       Relevant Medications   methylPREDNISolone (MEDROL DOSEPAK) 4 MG TBPK tablet   Radiculopathy    R LS 07/2021 w/RLE weakness       Other Visit Diagnoses     Need for pneumococcal vaccination    -  Primary   Relevant Orders   Pneumococcal conjugate vaccine 20-valent (Prevnar 20) (Completed)         Meds ordered this encounter  Medications   methylPREDNISolone (MEDROL DOSEPAK) 4 MG TBPK tablet    Sig: As directed    Dispense:  21 tablet    Refill:  0      Follow-up: Return for a follow-up visit.  Walker Kehr, MD

## 2021-08-18 NOTE — Assessment & Plan Note (Signed)
Hold Crestor x 2 weeks

## 2021-08-18 NOTE — Assessment & Plan Note (Signed)
L inner meniscus injury? Blue-Emu cream was recommended to use 2-3 times a day

## 2021-08-19 ENCOUNTER — Encounter: Payer: Self-pay | Admitting: Internal Medicine

## 2021-08-25 ENCOUNTER — Other Ambulatory Visit: Payer: Self-pay | Admitting: Internal Medicine

## 2021-08-25 DIAGNOSIS — M5410 Radiculopathy, site unspecified: Secondary | ICD-10-CM

## 2021-08-27 ENCOUNTER — Ambulatory Visit
Admission: RE | Admit: 2021-08-27 | Discharge: 2021-08-27 | Disposition: A | Discharge status: Home / Self Care | Payer: Medicare Other | Source: Ambulatory Visit | Attending: Internal Medicine | Admitting: Internal Medicine

## 2021-08-27 DIAGNOSIS — R29898 Other symptoms and signs involving the musculoskeletal system: Secondary | ICD-10-CM | POA: Diagnosis not present

## 2021-08-27 DIAGNOSIS — M48061 Spinal stenosis, lumbar region without neurogenic claudication: Secondary | ICD-10-CM | POA: Diagnosis not present

## 2021-08-27 DIAGNOSIS — M5410 Radiculopathy, site unspecified: Secondary | ICD-10-CM

## 2021-08-27 DIAGNOSIS — M545 Low back pain, unspecified: Secondary | ICD-10-CM | POA: Diagnosis not present

## 2021-10-09 ENCOUNTER — Other Ambulatory Visit: Payer: Self-pay | Admitting: Internal Medicine

## 2021-10-27 ENCOUNTER — Encounter: Payer: Self-pay | Admitting: Internal Medicine

## 2021-10-27 ENCOUNTER — Telehealth: Payer: Self-pay

## 2021-10-27 ENCOUNTER — Ambulatory Visit (INDEPENDENT_AMBULATORY_CARE_PROVIDER_SITE_OTHER): Payer: Medicare Other | Admitting: Internal Medicine

## 2021-10-27 VITALS — BP 102/60 | HR 66 | Temp 98.1°F | Ht 72.0 in | Wt 204.4 lb

## 2021-10-27 DIAGNOSIS — Z23 Encounter for immunization: Secondary | ICD-10-CM

## 2021-10-27 DIAGNOSIS — E785 Hyperlipidemia, unspecified: Secondary | ICD-10-CM

## 2021-10-27 DIAGNOSIS — D61818 Other pancytopenia: Secondary | ICD-10-CM | POA: Diagnosis not present

## 2021-10-27 DIAGNOSIS — N400 Enlarged prostate without lower urinary tract symptoms: Secondary | ICD-10-CM | POA: Diagnosis not present

## 2021-10-27 DIAGNOSIS — M5410 Radiculopathy, site unspecified: Secondary | ICD-10-CM

## 2021-10-27 LAB — CBC WITH DIFFERENTIAL/PLATELET
Basophils Absolute: 0 10*3/uL (ref 0.0–0.1)
Basophils Relative: 0.4 % (ref 0.0–3.0)
Eosinophils Absolute: 0.1 10*3/uL (ref 0.0–0.7)
Eosinophils Relative: 1.6 % (ref 0.0–5.0)
HCT: 34.7 % — ABNORMAL LOW (ref 39.0–52.0)
Hemoglobin: 12.4 g/dL — ABNORMAL LOW (ref 13.0–17.0)
Lymphocytes Relative: 30.8 % (ref 12.0–46.0)
Lymphs Abs: 1.1 10*3/uL (ref 0.7–4.0)
MCHC: 35.7 g/dL (ref 30.0–36.0)
MCV: 93.7 fl (ref 78.0–100.0)
Monocytes Absolute: 0.4 10*3/uL (ref 0.1–1.0)
Monocytes Relative: 11.8 % (ref 3.0–12.0)
Neutro Abs: 2 10*3/uL (ref 1.4–7.7)
Neutrophils Relative %: 55.4 % (ref 43.0–77.0)
Platelets: 128 10*3/uL — ABNORMAL LOW (ref 150.0–400.0)
RBC: 3.7 Mil/uL — ABNORMAL LOW (ref 4.22–5.81)
RDW: 13.3 % (ref 11.5–15.5)
WBC: 3.7 10*3/uL — ABNORMAL LOW (ref 4.0–10.5)

## 2021-10-27 LAB — URINALYSIS, ROUTINE W REFLEX MICROSCOPIC
Bilirubin Urine: NEGATIVE
Hgb urine dipstick: NEGATIVE
Ketones, ur: NEGATIVE
Leukocytes,Ua: NEGATIVE
Nitrite: NEGATIVE
RBC / HPF: NONE SEEN (ref 0–?)
Specific Gravity, Urine: >=1.03 — AB (ref 1.000–1.030)
Total Protein, Urine: 30 — AB
Urine Glucose: NEGATIVE
Urobilinogen, UA: 0.2 (ref 0.0–1.0)
pH: 5.5 (ref 5.0–8.0)

## 2021-10-27 LAB — COMPREHENSIVE METABOLIC PANEL
ALT: 18 U/L (ref 0–53)
AST: 16 U/L (ref 0–37)
Albumin: 4.2 g/dL (ref 3.5–5.2)
Alkaline Phosphatase: 54 U/L (ref 39–117)
BUN: 26 mg/dL — ABNORMAL HIGH (ref 6–23)
CO2: 29 mEq/L (ref 19–32)
Calcium: 9.5 mg/dL (ref 8.4–10.5)
Chloride: 104 mEq/L (ref 96–112)
Creatinine, Ser: 1.37 mg/dL (ref 0.40–1.50)
GFR: 51.63 mL/min — ABNORMAL LOW (ref >60.00)
Glucose, Bld: 94 mg/dL (ref 70–99)
Potassium: 4.5 mEq/L (ref 3.5–5.1)
Sodium: 138 mEq/L (ref 135–145)
Total Bilirubin: 0.8 mg/dL (ref 0.2–1.2)
Total Protein: 6.8 g/dL (ref 6.0–8.3)

## 2021-10-27 LAB — LIPID PANEL
Cholesterol: 111 mg/dL (ref 0–200)
HDL: 35.9 mg/dL — ABNORMAL LOW (ref >39.00)
LDL Cholesterol: 49 mg/dL (ref 0–99)
NonHDL: 74.8
Total CHOL/HDL Ratio: 3
Triglycerides: 130 mg/dL (ref 0.0–149.0)
VLDL: 26 mg/dL (ref 0.0–40.0)

## 2021-10-27 LAB — TSH: TSH: 1.52 u[IU]/mL (ref 0.35–5.50)

## 2021-10-27 LAB — PSA: PSA: 1.24 ng/mL (ref 0.10–4.00)

## 2021-10-27 MED ORDER — DOXAZOSIN MESYLATE 4 MG PO TABS
ORAL_TABLET | ORAL | 3 refills | Status: DC
Start: 2021-10-27 — End: 2021-10-27

## 2021-10-27 MED ORDER — ROSUVASTATIN CALCIUM 5 MG PO TABS
ORAL_TABLET | ORAL | 3 refills | Status: DC
Start: 2021-10-27 — End: 2021-10-27

## 2021-10-27 MED ORDER — DOXAZOSIN MESYLATE 4 MG PO TABS: ORAL_TABLET | ORAL | 3 refills | Status: DC

## 2021-10-27 MED ORDER — ROSUVASTATIN CALCIUM 5 MG PO TABS: ORAL_TABLET | ORAL | 3 refills | Status: DC

## 2021-10-27 NOTE — Telephone Encounter (Signed)
Resent rx's to [facility].Marland KitchenJohny [name]

## 2021-10-27 NOTE — Progress Notes (Signed)
Subjective:  Patient ID: Richard [name], male    DOB: 06-02-1950  Age: 71 y.o. MRN: [id number]  CC: Follow-up (WANT FLU SHOT)   HPI Richard [name] presents for LBP, R LS radiculopathy - better, abn CBC, dyslipidemia  Outpatient Medications Prior to Visit  Medication Sig Dispense Refill   b complex vitamins capsule Take 1 capsule by mouth daily.     Cholecalciferol (VITAMIN D) 2000 UNITS CAPS Take by mouth daily.     Flaxseed, Linseed, (FLAXSEED OIL PO) Take by mouth. Takes 1400 mg daily     Fluorouracil (TOLAK) 4 % CREA Apply 1 application. topically at bedtime. Apply topically nightly for 2 weeks 40 g 0   methylPREDNISolone (MEDROL DOSEPAK) 4 MG TBPK tablet As directed 21 tablet 0   Multiple Vitamin (MULTIVITAMIN) tablet Take 1 tablet by mouth daily.     OVER THE COUNTER MEDICATION Lion's Mane (OTC) take 1 by mouth daily     Thiamine HCl (VITAMIN B-1 PO) Take by mouth daily.     triamcinolone cream (KENALOG) 0.1 % Apply 1 application topically 2 (two) times daily. 45 g 0   Turmeric (QC TUMERIC COMPLEX PO) Take by mouth.     vitamin C (ASCORBIC ACID) 500 MG tablet Take 500 mg by mouth daily.     doxazosin (CARDURA) 4 MG tablet TAKE 1 TABLET BY MOUTH DAILY ;  GENERIC EQUIVALENT FOR CARDURA 100 tablet 1   rosuvastatin (CRESTOR) 5 MG tablet TAKE 1 TABLET BY MOUTH DAILY 100 tablet 1   No facility-administered medications prior to visit.    ROS: Review of Systems  Constitutional:  Negative for appetite change, fatigue and unexpected weight change.  HENT:  Negative for congestion, nosebleeds, sneezing, sore throat and trouble swallowing.   Eyes:  Negative for itching and visual disturbance.  Respiratory:  Negative for cough.   Cardiovascular:  Negative for chest pain, palpitations and leg swelling.  Gastrointestinal:  Negative for abdominal distention, blood in stool, diarrhea and nausea.  Genitourinary:  Negative for frequency and hematuria.  Musculoskeletal:  Positive for  back pain. Negative for gait problem, joint swelling and neck pain.  Skin:  Negative for rash.  Neurological:  Negative for dizziness, tremors, speech difficulty and weakness.  Psychiatric/Behavioral:  Negative for agitation, dysphoric mood and sleep disturbance. The patient is not nervous/anxious.     Objective:  BP 102/60 (BP Location: Left Arm)   Pulse 66   Temp 98.1 F (36.7 C) (Oral)   Ht 6' (1.829 m)   Wt 204 lb 6.4 oz (92.7 kg)   SpO2 96%   BMI 27.72 kg/m   BP Readings from Last 3 Encounters:  10/27/21 102/60  08/18/21 110/70  06/25/21 110/62    Wt Readings from Last 3 Encounters:  10/27/21 204 lb 6.4 oz (92.7 kg)  08/18/21 209 lb (94.8 kg)  06/25/21 209 lb (94.8 kg)    Physical Exam Constitutional:      General: He is not in acute distress.    Appearance: Normal appearance. He is well-developed.     Comments: NAD  Eyes:     Conjunctiva/sclera: Conjunctivae normal.     Pupils: Pupils are equal, round, and reactive to light.  Neck:     Thyroid: No thyromegaly.     Vascular: No JVD.  Cardiovascular:     Rate and Rhythm: Normal rate and regular rhythm.     Heart sounds: Normal heart sounds. No murmur heard.    No friction rub. No gallop.  Pulmonary:     Effort: Pulmonary effort is normal. No respiratory distress.     Breath sounds: Normal breath sounds. No wheezing or rales.  Chest:     Chest wall: No tenderness.  Abdominal:     General: Bowel sounds are normal. There is no distension.     Palpations: Abdomen is soft. There is no mass.     Tenderness: There is no abdominal tenderness. There is no guarding or rebound.  Musculoskeletal:        General: No tenderness. Normal range of motion.     Cervical back: Normal range of motion.  Lymphadenopathy:     Cervical: No cervical adenopathy.  Skin:    General: Skin is warm and dry.     Findings: No rash.  Neurological:     Mental Status: He is alert and oriented to person, place, and time.     Cranial  Nerves: No cranial nerve deficit.     Motor: No abnormal muscle tone.     Coordination: Coordination normal.     Gait: Gait normal.     Deep Tendon Reflexes: Reflexes are normal and symmetric.  Psychiatric:        Behavior: Behavior normal.        Thought Content: Thought content normal.        Judgment: Judgment normal.     Lab Results  Component Value Date   WBC 4.1 04/03/2021   HGB 12.6 (L) 04/03/2021   HCT 35.4 (L) 04/03/2021   PLT 125 (L) 04/03/2021   GLUCOSE 113 (H) 04/03/2021   CHOL 128 02/12/2021   TRIG 182.0 (H) 02/12/2021   HDL 37.60 (L) 02/12/2021   LDLDIRECT 80.9 04/16/2011   LDLCALC 54 02/12/2021   ALT 19 04/03/2021   AST 15 04/03/2021   NA 138 04/03/2021   K 4.3 04/03/2021   CL 104 04/03/2021   CREATININE 1.21 04/03/2021   BUN 24 (H) 04/03/2021   CO2 27 04/03/2021   TSH 3.15 02/12/2021   PSA 1.10 02/12/2021   HGBA1C 5.1 10/27/2018    MR Lumbar Spine Wo Contrast  Result Date: 08/29/2021 CLINICAL DATA:  Right side low back pain and right leg weakness for 6-9 months. No known injury. EXAM: MRI LUMBAR SPINE WITHOUT CONTRAST TECHNIQUE: Multiplanar, multisequence MR imaging of the lumbar spine was performed. No intravenous contrast was administered. COMPARISON:  Plain films lumbar spine 06/26/2019. FINDINGS: Segmentation: On the prior plain films, the patient appears to have transitional lumbosacral anatomy. On this study, the lowest level imaged in the axial plane which is also the last fully open disc space is labeled L5-S1. Alignment:  Maintained. Vertebrae:  No fracture, evidence of discitis, or bone lesion. Conus medullaris and cauda equina: Conus extends to the L1 level. Conus and cauda equina appear normal. Paraspinal and other soft tissues: Negative. Disc levels: T12-L1: Shallow central protrusion without stenosis. L1-2: Negative. L2-3: Mild-to-moderate facet degenerative disease and a shallow disc bulge. There is mild central canal narrowing. The foramina are  open. L3-4: Moderate facet arthropathy, shallow disc bulge and ligamentum flavum thickening. There is moderate central canal stenosis with some narrowing in the subarticular recesses. The foramina mildly narrowed. L4-5: There is a shallow disc bulge with a superimposed small, down turning right lateral recess disc protrusion. Mild to moderate facet arthropathy and ligamentum flavum thickening are seen. Moderate central canal stenosis is present and there is narrowing in the right subarticular recess with encroachment on the descending right L5 root. Mild bilateral foraminal  narrowing is seen. L5-S1: Negative. IMPRESSION: Transitional lumbosacral anatomy. Please see numbering scheme above and correlate with plain films if any intervention is planned. Disc bulge with a small down turning right subarticular recess protrusion at L4-5 results in impingement on the descending right L5 root and moderate central canal stenosis. Mild bilateral foraminal narrowing is also seen at L4-5. Moderate central canal stenosis at L3-4 where there is also mild bilateral foraminal narrowing. Electronically Signed   By: Inge Rise M.D.   On: 08/29/2021 08:39    Assessment & Plan:   Problem List Items Addressed This Visit     BPH (benign prostatic hyperplasia)   Relevant Orders   Urinalysis   PSA   Dyslipidemia    Check labs On Crestor      Relevant Medications   rosuvastatin (CRESTOR) 5 MG tablet   Other Relevant Orders   TSH   Urinalysis   Lipid panel   Comprehensive metabolic panel   Pancytopenia (HCC)    Monitoring CBC      Relevant Orders   TSH   Urinalysis   CBC with Differential/Platelet   Comprehensive metabolic panel   Radiculopathy    Spine MRI shows a disc bulge with a small down turning right subarticular recess protrusion at L4-5 results in impingement on the descending right L5 root and moderate central canal stenosis.  This is likely responsible for the symptoms. Richard [name] declined to see a  neurosurgeon or to have PT at present. Discussed       Other Visit Diagnoses     Needs flu shot    -  Primary   Relevant Orders   Flu Vaccine QUAD High Dose(Fluad) (Completed)         Meds ordered this encounter  Medications   rosuvastatin (CRESTOR) 5 MG tablet    Sig: TAKE 1 TABLET BY MOUTH DAILY    Dispense:  100 tablet    Refill:  3    Please send a replace/new response with 100-Day Supply if appropriate to maximize member benefit. Requesting 1 year supply.   doxazosin (CARDURA) 4 MG tablet    Sig: TAKE 1 TABLET BY MOUTH DAILY ;  GENERIC EQUIVALENT FOR CARDURA    Dispense:  100 tablet    Refill:  3    Please send a replace/new response with 100-Day Supply if appropriate to maximize member benefit. Requesting 1 year supply.      Follow-up: Return in about 6 months (around 04/27/2022) for a follow-up visit.  Walker Kehr, MD

## 2021-10-27 NOTE — Telephone Encounter (Signed)
Pt wife is calling stating all routine medication has to be sent into the mail order for insurance purposes.  Please re send doxazosin (CARDURA) 4 MG tablet and rosuvastatin (CRESTOR) 5 MG tablet  Pharmacy: [facility] ([facility]) - [place], [place]  [place] 10/27/21 ROV 04/26/21

## 2021-10-27 NOTE — Assessment & Plan Note (Signed)
Check labs On Crestor

## 2021-10-27 NOTE — Assessment & Plan Note (Signed)
Spine MRI shows a disc bulge with a small down turning right subarticular recess protrusion at L4-5 results in impingement on the descending right L5 root and moderate central canal stenosis. This is likely responsible for the symptoms. [name] declined to see a neurosurgeon or to have PT at present. Discussed

## 2021-10-27 NOTE — Assessment & Plan Note (Signed)
Monitoring CBC 

## 2022-01-06 ENCOUNTER — Ambulatory Visit (INDEPENDENT_AMBULATORY_CARE_PROVIDER_SITE_OTHER): Payer: Medicare Other | Admitting: Internal Medicine

## 2022-01-06 ENCOUNTER — Encounter: Payer: Self-pay | Admitting: Internal Medicine

## 2022-01-06 VITALS — BP 102/62 | HR 69 | Temp 98.8°F | Ht 72.0 in | Wt 204.0 lb

## 2022-01-06 DIAGNOSIS — M5410 Radiculopathy, site unspecified: Secondary | ICD-10-CM

## 2022-01-06 DIAGNOSIS — C4490 Unspecified malignant neoplasm of skin, unspecified: Secondary | ICD-10-CM

## 2022-01-06 NOTE — Assessment & Plan Note (Signed)
Ref to [facility] -- h/o skin cancer on the R temple - lesion relapsed?

## 2022-01-06 NOTE — Progress Notes (Signed)
Subjective:  Patient ID: Richard [name], male    DOB: [birth year]  Age: 71 y.o. MRN: [id number]  CC: Referral (+Want a referral to dermatologist. Have mole coming back over (R) eye that was cancerous)   HPI Richard [name] presents for h/o skin cancer on the R temple - lesion relapsed? F/u LBP  Outpatient Medications Prior to Visit  Medication Sig Dispense Refill   b complex vitamins capsule Take 1 capsule by mouth daily.     Cholecalciferol (VITAMIN D) 2000 UNITS CAPS Take by mouth daily.     doxazosin (CARDURA) 4 MG tablet TAKE 1 TABLET BY MOUTH DAILY ;  GENERIC EQUIVALENT FOR CARDURA 100 tablet 3   Flaxseed, Linseed, (FLAXSEED OIL PO) Take by mouth. Takes 1400 mg daily     Fluorouracil (TOLAK) 4 % CREA Apply 1 application. topically at bedtime. Apply topically nightly for 2 weeks 40 g 0   methylPREDNISolone (MEDROL DOSEPAK) 4 MG TBPK tablet As directed 21 tablet 0   Multiple Vitamin (MULTIVITAMIN) tablet Take 1 tablet by mouth daily.     OVER THE COUNTER MEDICATION Lion's Mane (OTC) take 1 by mouth daily     rosuvastatin (CRESTOR) 5 MG tablet TAKE 1 TABLET BY MOUTH DAILY 100 tablet 3   Thiamine HCl (VITAMIN B-1 PO) Take by mouth daily.     triamcinolone cream (KENALOG) 0.1 % Apply 1 application topically 2 (two) times daily. 45 g 0   Turmeric (QC TUMERIC COMPLEX PO) Take by mouth.     vitamin C (ASCORBIC ACID) 500 MG tablet Take 500 mg by mouth daily.     No facility-administered medications prior to visit.    ROS: Review of Systems  Musculoskeletal:  Positive for back pain.  Skin:  Positive for color change.    Objective:  BP 102/62 (BP Location: Left Arm)   Pulse 69   Temp 98.8 F (37.1 C) (Oral)   Ht 6' (1.829 m)   Wt 204 lb (92.5 kg)   SpO2 96%   BMI 27.67 kg/m   BP Readings from Last 3 Encounters:  01/06/22 102/62  10/27/21 102/60  08/18/21 110/70    Wt Readings from Last 3 Encounters:  01/06/22 204 lb (92.5 kg)  10/27/21 204 lb 6.4 oz (92.7 kg)   08/18/21 209 lb (94.8 kg)    Physical Exam Constitutional:      Appearance: Normal appearance.  Skin:    Findings: Lesion present.  Neurological:     Mental Status: He is oriented to person, place, and time.  Psychiatric:        Mood and Affect: Mood normal.    7 mm pink ulcer R temple Lab Results  Component Value Date   WBC 3.7 (L) 10/27/2021   HGB 12.4 (L) 10/27/2021   HCT 34.7 (L) 10/27/2021   PLT 128.0 (L) 10/27/2021   GLUCOSE 94 10/27/2021   CHOL 111 10/27/2021   TRIG 130.0 10/27/2021   HDL 35.90 (L) 10/27/2021   LDLDIRECT 80.9 04/16/2011   LDLCALC 49 10/27/2021   ALT 18 10/27/2021   AST 16 10/27/2021   NA 138 10/27/2021   K 4.5 10/27/2021   CL 104 10/27/2021   CREATININE 1.37 10/27/2021   BUN 26 (H) 10/27/2021   CO2 29 10/27/2021   TSH 1.52 10/27/2021   PSA 1.24 10/27/2021   HGBA1C 5.1 10/27/2018    MR Lumbar Spine Wo Contrast  Result Date: 08/29/2021 CLINICAL DATA:  Right side low back pain and right leg weakness  for 6-9 months. No known injury. EXAM: MRI LUMBAR SPINE WITHOUT CONTRAST TECHNIQUE: Multiplanar, multisequence MR imaging of the lumbar spine was performed. No intravenous contrast was administered. COMPARISON:  Plain films lumbar spine 06/26/2019. FINDINGS: Segmentation: On the prior plain films, the patient appears to have transitional lumbosacral anatomy. On this study, the lowest level imaged in the axial plane which is also the last fully open disc space is labeled L5-S1. Alignment:  Maintained. Vertebrae:  No fracture, evidence of discitis, or bone lesion. Conus medullaris and cauda equina: Conus extends to the L1 level. Conus and cauda equina appear normal. Paraspinal and other soft tissues: Negative. Disc levels: T12-L1: Shallow central protrusion without stenosis. L1-2: Negative. L2-3: Mild-to-moderate facet degenerative disease and a shallow disc bulge. There is mild central canal narrowing. The foramina are open. L3-4: Moderate facet arthropathy,  shallow disc bulge and ligamentum flavum thickening. There is moderate central canal stenosis with some narrowing in the subarticular recesses. The foramina mildly narrowed. L4-5: There is a shallow disc bulge with a superimposed small, down turning right lateral recess disc protrusion. Mild to moderate facet arthropathy and ligamentum flavum thickening are seen. Moderate central canal stenosis is present and there is narrowing in the right subarticular recess with encroachment on the descending right L5 root. Mild bilateral foraminal narrowing is seen. L5-S1: Negative. IMPRESSION: Transitional lumbosacral anatomy. Please see numbering scheme above and correlate with plain films if any intervention is planned. Disc bulge with a small down turning right subarticular recess protrusion at L4-5 results in impingement on the descending right L5 root and moderate central canal stenosis. Mild bilateral foraminal narrowing is also seen at L4-5. Moderate central canal stenosis at L3-4 where there is also mild bilateral foraminal narrowing. Electronically Signed   By: Inge Rise M.D.   On: 08/29/2021 08:39    Assessment & Plan:   Problem List Items Addressed This Visit     Skin cancer - Primary    Ref to [facility] -- h/o skin cancer on the R temple - lesion relapsed?      Relevant Orders   Ambulatory referral to Dermatology   Radiculopathy    Overall better         No orders of the defined types were placed in this encounter.     Follow-up: Return for a follow-up visit.  Walker Kehr, MD

## 2022-01-06 NOTE — Assessment & Plan Note (Signed)
Overall better 

## 2022-01-07 ENCOUNTER — Ambulatory Visit: Payer: Medicare Other | Admitting: Internal Medicine

## 2022-01-15 DIAGNOSIS — D485 Neoplasm of uncertain behavior of skin: Secondary | ICD-10-CM | POA: Diagnosis not present

## 2022-01-15 DIAGNOSIS — L738 Other specified follicular disorders: Secondary | ICD-10-CM | POA: Diagnosis not present

## 2022-02-11 DIAGNOSIS — D225 Melanocytic nevi of trunk: Secondary | ICD-10-CM | POA: Diagnosis not present

## 2022-02-11 DIAGNOSIS — L814 Other melanin hyperpigmentation: Secondary | ICD-10-CM | POA: Diagnosis not present

## 2022-02-11 DIAGNOSIS — L821 Other seborrheic keratosis: Secondary | ICD-10-CM | POA: Diagnosis not present

## 2022-02-11 DIAGNOSIS — Z08 Encounter for follow-up examination after completed treatment for malignant neoplasm: Secondary | ICD-10-CM | POA: Diagnosis not present

## 2022-02-11 DIAGNOSIS — D0439 Carcinoma in situ of skin of other parts of face: Secondary | ICD-10-CM | POA: Diagnosis not present

## 2022-02-11 DIAGNOSIS — Z85828 Personal history of other malignant neoplasm of skin: Secondary | ICD-10-CM | POA: Diagnosis not present

## 2022-03-30 ENCOUNTER — Other Ambulatory Visit: Payer: Self-pay

## 2022-03-30 ENCOUNTER — Encounter: Payer: Self-pay | Admitting: Hematology & Oncology

## 2022-03-30 ENCOUNTER — Inpatient Hospital Stay: Payer: Medicare Other | Attending: Hematology & Oncology

## 2022-03-30 ENCOUNTER — Inpatient Hospital Stay: Payer: Medicare Other | Admitting: Hematology & Oncology

## 2022-03-30 VITALS — BP 96/67 | HR 72 | Temp 98.5°F | Resp 18 | Wt 205.0 lb

## 2022-03-30 DIAGNOSIS — D61818 Other pancytopenia: Secondary | ICD-10-CM | POA: Diagnosis not present

## 2022-03-30 DIAGNOSIS — Z79899 Other long term (current) drug therapy: Secondary | ICD-10-CM | POA: Insufficient documentation

## 2022-03-30 DIAGNOSIS — Z886 Allergy status to analgesic agent status: Secondary | ICD-10-CM | POA: Insufficient documentation

## 2022-03-30 LAB — CBC WITH DIFFERENTIAL (CANCER CENTER ONLY)
Abs Immature Granulocytes: 0.01 10*3/uL (ref 0.00–0.07)
Basophils Absolute: 0 10*3/uL (ref 0.0–0.1)
Basophils Relative: 1 %
Eosinophils Absolute: 0.1 10*3/uL (ref 0.0–0.5)
Eosinophils Relative: 2 %
HCT: 35.4 % — ABNORMAL LOW (ref 39.0–52.0)
Hemoglobin: 12.6 g/dL — ABNORMAL LOW (ref 13.0–17.0)
Immature Granulocytes: 0 %
Lymphocytes Relative: 30 %
Lymphs Abs: 1.3 10*3/uL (ref 0.7–4.0)
MCH: 32.7 pg (ref 26.0–34.0)
MCHC: 35.6 g/dL (ref 30.0–36.0)
MCV: 91.9 fL (ref 80.0–100.0)
Monocytes Absolute: 0.5 10*3/uL (ref 0.1–1.0)
Monocytes Relative: 12 %
Neutro Abs: 2.3 10*3/uL (ref 1.7–7.7)
Neutrophils Relative %: 55 %
Platelet Count: 131 10*3/uL — ABNORMAL LOW (ref 150–400)
RBC: 3.85 MIL/uL — ABNORMAL LOW (ref 4.22–5.81)
RDW: 12.3 % (ref 11.5–15.5)
WBC Count: 4.2 10*3/uL (ref 4.0–10.5)
nRBC: 0 % (ref 0.0–0.2)

## 2022-03-30 LAB — CMP (CANCER CENTER ONLY)
ALT: 21 U/L (ref 0–44)
AST: 19 U/L (ref 15–41)
Albumin: 4.2 g/dL (ref 3.5–5.0)
Alkaline Phosphatase: 62 U/L (ref 38–126)
Anion gap: 4 — ABNORMAL LOW (ref 5–15)
BUN: 25 mg/dL — ABNORMAL HIGH (ref 8–23)
CO2: 25 mmol/L (ref 22–32)
Calcium: 8.7 mg/dL — ABNORMAL LOW (ref 8.9–10.3)
Chloride: 107 mmol/L (ref 98–111)
Creatinine: 1.25 mg/dL — ABNORMAL HIGH (ref 0.61–1.24)
GFR, Estimated: >60 mL/min (ref >60)
Glucose, Bld: 106 mg/dL — ABNORMAL HIGH (ref 70–99)
Potassium: 4.1 mmol/L (ref 3.5–5.1)
Sodium: 136 mmol/L (ref 135–145)
Total Bilirubin: 0.8 mg/dL (ref 0.3–1.2)
Total Protein: 7.2 g/dL (ref 6.5–8.1)

## 2022-03-30 LAB — LACTATE DEHYDROGENASE: LDH: 147 U/L (ref 98–192)

## 2022-03-30 LAB — SAVE SMEAR(SSMR), FOR PROVIDER SLIDE REVIEW

## 2022-03-30 NOTE — Progress Notes (Signed)
Hematology and Oncology Follow Up Visit  NISHIL LAZO [id number] 07/22/1950 72 y.o. 03/30/2022   Principle Diagnosis:  Pancytopenia -- mild  Current Therapy:   Observation     Interim History:  Mr. Szostak is back for follow-up.  It has been a year since we last saw him.  He comes in with his wife.  They are both doing quite nicely.  They have been quite active.  They are being very diligent with avoiding COVID.  He has had no problems with bleeding.  He has had no fever.  He has had no cough or shortness of breath.  He has had no change in bowel or bladder habits.  He has had no nausea or vomiting.  He has had no rashes.  Overall, I would say that his performance status is ECOG 0.      Medications:  Current Outpatient Medications:    b complex vitamins capsule, Take 1 capsule by mouth daily., Disp: , Rfl:    Cholecalciferol (VITAMIN D) 2000 UNITS CAPS, Take by mouth daily., Disp: , Rfl:    doxazosin (CARDURA) 4 MG tablet, TAKE 1 TABLET BY MOUTH DAILY ;  GENERIC EQUIVALENT FOR CARDURA, Disp: 100 tablet, Rfl: 3   Flaxseed, Linseed, (FLAXSEED OIL PO), Take by mouth. Takes 1400 mg daily, Disp: , Rfl:    Fluorouracil (TOLAK) 4 % CREA, Apply 1 application. topically at bedtime. Apply topically nightly for 2 weeks, Disp: 40 g, Rfl: 0   methylPREDNISolone (MEDROL DOSEPAK) 4 MG TBPK tablet, As directed, Disp: 21 tablet, Rfl: 0   Multiple Vitamin (MULTIVITAMIN) tablet, Take 1 tablet by mouth daily., Disp: , Rfl:    OVER THE COUNTER MEDICATION, Lion's Mane (OTC) take 1 by mouth daily, Disp: , Rfl:    rosuvastatin (CRESTOR) 5 MG tablet, TAKE 1 TABLET BY MOUTH DAILY, Disp: 100 tablet, Rfl: 3   Thiamine HCl (VITAMIN B-1 PO), Take by mouth daily., Disp: , Rfl:    triamcinolone cream (KENALOG) 0.1 %, Apply 1 application topically 2 (two) times daily., Disp: 45 g, Rfl: 0   Turmeric (QC TUMERIC COMPLEX PO), Take by mouth., Disp: , Rfl:    vitamin C (ASCORBIC ACID) 500 MG tablet, Take 500 mg by  mouth daily., Disp: , Rfl:   Allergies:  Allergies  Allergen Reactions   Aspirin Rash    Past Medical History, Surgical history, Social history, and Family History were reviewed and updated.  Review of Systems: Review of Systems  Constitutional: Negative.   HENT:  Negative.    Eyes: Negative.   Respiratory: Negative.    Cardiovascular: Negative.   Gastrointestinal: Negative.   Endocrine: Negative.   Genitourinary: Negative.    Musculoskeletal: Negative.   Skin: Negative.   Neurological: Negative.   Hematological: Negative.   Psychiatric/Behavioral: Negative.      Physical Exam:  weight is 205 lb (93 kg). His oral temperature is 98.5 F (36.9 C). His blood pressure is 96/67 and his pulse is 72. His respiration is 18 and oxygen saturation is 96%.   Wt Readings from Last 3 Encounters:  03/30/22 205 lb (93 kg)  01/06/22 204 lb (92.5 kg)  10/27/21 204 lb 6.4 oz (92.7 kg)    Physical Exam Vitals reviewed.  HENT:     Head: Normocephalic and atraumatic.  Eyes:     Pupils: Pupils are equal, round, and reactive to light.  Cardiovascular:     Rate and Rhythm: Normal rate and regular rhythm.     Heart sounds:  Normal heart sounds.  Pulmonary:     Effort: Pulmonary effort is normal.     Breath sounds: Normal breath sounds.  Abdominal:     General: Bowel sounds are normal.     Palpations: Abdomen is soft.  Musculoskeletal:        General: No tenderness or deformity. Normal range of motion.     Cervical back: Normal range of motion.  Lymphadenopathy:     Cervical: No cervical adenopathy.  Skin:    General: Skin is warm and dry.     Findings: No erythema or rash.  Neurological:     Mental Status: He is alert and oriented to person, place, and time.  Psychiatric:        Behavior: Behavior normal.        Thought Content: Thought content normal.        Judgment: Judgment normal.      Lab Results  Component Value Date   WBC 4.2 03/30/2022   HGB 12.6 (L) 03/30/2022    HCT 35.4 (L) 03/30/2022   MCV 91.9 03/30/2022   PLT 131 (L) 03/30/2022     Chemistry      Component Value Date/Time   NA 138 10/27/2021 0955   K 4.5 10/27/2021 0955   CL 104 10/27/2021 0955   CO2 29 10/27/2021 0955   BUN 26 (H) 10/27/2021 0955   CREATININE 1.37 10/27/2021 0955   CREATININE 1.21 04/03/2021 0833   CREATININE 1.13 10/30/2019 0850      Component Value Date/Time   CALCIUM 9.5 10/27/2021 0955   ALKPHOS 54 10/27/2021 0955   AST 16 10/27/2021 0955   AST 15 04/03/2021 0833   ALT 18 10/27/2021 0955   ALT 19 04/03/2021 0833   BILITOT 0.8 10/27/2021 0955   BILITOT 0.7 04/03/2021 0833      Impression and Plan: Mr. Broberg is a very nice 72 year old white male.  He has mild pancytopenia.  In 1 year, his blood counts really have not changed at all.  I looked at his blood smear.  I do not see any immature myeloid or lymphoid forms.  There is no nucleated red blood cells.  I saw no schistocytes or spherocytes.  Platelets were well granulated.  Clearly, yearly follow-up would be reasonable.  He can always come back sooner if he has any problems.  Again he and his wife are both very active.  They really are quite diligent with taking care of their health.  I must give him a lot of credit for that.     Volanda Napoleon, MD 2/12/20248:03 AM

## 2022-04-27 ENCOUNTER — Ambulatory Visit (INDEPENDENT_AMBULATORY_CARE_PROVIDER_SITE_OTHER): Payer: Medicare Other | Admitting: Internal Medicine

## 2022-04-27 ENCOUNTER — Ambulatory Visit (INDEPENDENT_AMBULATORY_CARE_PROVIDER_SITE_OTHER): Payer: Medicare Other

## 2022-04-27 ENCOUNTER — Encounter: Payer: Self-pay | Admitting: Internal Medicine

## 2022-04-27 VITALS — BP 102/60 | HR 73 | Temp 98.3°F | Ht 72.0 in | Wt 208.0 lb

## 2022-04-27 VITALS — Ht 72.0 in

## 2022-04-27 DIAGNOSIS — N400 Enlarged prostate without lower urinary tract symptoms: Secondary | ICD-10-CM | POA: Diagnosis not present

## 2022-04-27 DIAGNOSIS — Z0000 Encounter for general adult medical examination without abnormal findings: Secondary | ICD-10-CM

## 2022-04-27 DIAGNOSIS — I2583 Coronary atherosclerosis due to lipid rich plaque: Secondary | ICD-10-CM

## 2022-04-27 DIAGNOSIS — E785 Hyperlipidemia, unspecified: Secondary | ICD-10-CM

## 2022-04-27 DIAGNOSIS — I2510 Atherosclerotic heart disease of native coronary artery without angina pectoris: Secondary | ICD-10-CM | POA: Diagnosis not present

## 2022-04-27 DIAGNOSIS — D61818 Other pancytopenia: Secondary | ICD-10-CM | POA: Diagnosis not present

## 2022-04-27 NOTE — Progress Notes (Signed)
Subjective:  Patient ID: Michel Harrow, male    DOB: [birth year]  Age: 72 y.o. MRN: [id number]  CC: Follow-up (6 mnth follow-up)   HPI Avyukt Higinbotham Witucki presents for   Outpatient Medications Prior to Visit  Medication Sig Dispense Refill  . b complex vitamins capsule Take 1 capsule by mouth daily.    . Cholecalciferol (VITAMIN D) 2000 UNITS CAPS Take by mouth daily.    Marland Kitchen doxazosin (CARDURA) 4 MG tablet TAKE 1 TABLET BY MOUTH DAILY ;  GENERIC EQUIVALENT FOR CARDURA 100 tablet 3  . Flaxseed, Linseed, (FLAXSEED OIL PO) Take by mouth. Takes 1400 mg daily    . Fluorouracil (TOLAK) 4 % CREA Apply 1 application. topically at bedtime. Apply topically nightly for 2 weeks 40 g 0  . methylPREDNISolone (MEDROL DOSEPAK) 4 MG TBPK tablet As directed 21 tablet 0  . Multiple Vitamin (MULTIVITAMIN) tablet Take 1 tablet by mouth daily.    Marland Kitchen OVER THE COUNTER MEDICATION [place] (OTC) take 1 by mouth daily    . rosuvastatin (CRESTOR) 5 MG tablet TAKE 1 TABLET BY MOUTH DAILY 100 tablet 3  . Thiamine HCl (VITAMIN B-1 PO) Take by mouth daily.    Marland Kitchen triamcinolone cream (KENALOG) 0.1 % Apply 1 application topically 2 (two) times daily. 45 g 0  . Turmeric (QC TUMERIC COMPLEX PO) Take by mouth.    . vitamin C (ASCORBIC ACID) 500 MG tablet Take 500 mg by mouth daily.     No facility-administered medications prior to visit.    ROS: Review of Systems  Constitutional:  Negative for appetite change, fatigue and unexpected weight change.  HENT:  Negative for congestion, nosebleeds, sneezing, sore throat and trouble swallowing.   Eyes:  Negative for itching and visual disturbance.  Respiratory:  Negative for cough.   Cardiovascular:  Negative for chest pain, palpitations and leg swelling.  Gastrointestinal:  Negative for abdominal distention, blood in stool, diarrhea and nausea.  Genitourinary:  Negative for frequency and hematuria.  Musculoskeletal:  Negative for back pain, gait problem, joint swelling  and neck pain.  Skin:  Negative for rash.  Neurological:  Negative for dizziness, tremors, speech difficulty and weakness.  Psychiatric/Behavioral:  Negative for agitation, dysphoric mood and sleep disturbance. The patient is not nervous/anxious.     Objective:  Ht 6' (1.829 m)   BMI 28.21 kg/m   BP Readings from Last 3 Encounters:  04/27/22 102/60  03/30/22 96/67  01/06/22 102/62    Wt Readings from Last 3 Encounters:  04/27/22 208 lb (94.3 kg)  03/30/22 205 lb (93 kg)  01/06/22 204 lb (92.5 kg)    Physical Exam  Lab Results  Component Value Date   WBC 4.2 03/30/2022   HGB 12.6 (L) 03/30/2022   HCT 35.4 (L) 03/30/2022   PLT 131 (L) 03/30/2022   GLUCOSE 106 (H) 03/30/2022   CHOL 111 10/27/2021   TRIG 130.0 10/27/2021   HDL 35.90 (L) 10/27/2021   LDLDIRECT 80.9 04/16/2011   LDLCALC 49 10/27/2021   ALT 21 03/30/2022   AST 19 03/30/2022   NA 136 03/30/2022   K 4.1 03/30/2022   CL 107 03/30/2022   CREATININE 1.25 (H) 03/30/2022   BUN 25 (H) 03/30/2022   CO2 25 03/30/2022   TSH 1.52 10/27/2021   PSA 1.24 10/27/2021   HGBA1C 5.1 10/27/2018    MR Lumbar Spine Wo Contrast  Result Date: 08/29/2021 CLINICAL DATA:  Right side low back pain and right leg weakness for 6-9  months. No known injury. EXAM: MRI LUMBAR SPINE WITHOUT CONTRAST TECHNIQUE: Multiplanar, multisequence MR imaging of the lumbar spine was performed. No intravenous contrast was administered. COMPARISON:  Plain films lumbar spine 06/26/2019. FINDINGS: Segmentation: On the prior plain films, the patient appears to have transitional lumbosacral anatomy. On this study, the lowest level imaged in the axial plane which is also the last fully open disc space is labeled L5-S1. Alignment:  Maintained. Vertebrae:  No fracture, evidence of discitis, or bone lesion. Conus medullaris and cauda equina: Conus extends to the L1 level. Conus and cauda equina appear normal. Paraspinal and other soft tissues: Negative. Disc  levels: T12-L1: Shallow central protrusion without stenosis. L1-2: Negative. L2-3: Mild-to-moderate facet degenerative disease and a shallow disc bulge. There is mild central canal narrowing. The foramina are open. L3-4: Moderate facet arthropathy, shallow disc bulge and ligamentum flavum thickening. There is moderate central canal stenosis with some narrowing in the subarticular recesses. The foramina mildly narrowed. L4-5: There is a shallow disc bulge with a superimposed small, down turning right lateral recess disc protrusion. Mild to moderate facet arthropathy and ligamentum flavum thickening are seen. Moderate central canal stenosis is present and there is narrowing in the right subarticular recess with encroachment on the descending right L5 root. Mild bilateral foraminal narrowing is seen. L5-S1: Negative. IMPRESSION: Transitional lumbosacral anatomy. Please see numbering scheme above and correlate with plain films if any intervention is planned. Disc bulge with a small down turning right subarticular recess protrusion at L4-5 results in impingement on the descending right L5 root and moderate central canal stenosis. Mild bilateral foraminal narrowing is also seen at L4-5. Moderate central canal stenosis at L3-4 where there is also mild bilateral foraminal narrowing. Electronically Signed   By: Inge Rise M.D.   On: 08/29/2021 08:39    Assessment & Plan:   Problem List Items Addressed This Visit       Cardiovascular and Mediastinum   Coronary atherosclerosis    On Crestor 3 per wk       Relevant Orders   TSH   Urinalysis     Genitourinary   BPH (benign prostatic hyperplasia)   Relevant Orders   PSA     Hematopoietic and Hemostatic   [place]topenia ([place] - Primary    ?etiology F/u w/Dr Marin Olp Monitoring CBC        Other   Dyslipidemia    Crestor 3 per wk       Relevant Orders   TSH   Urinalysis   Lipid panel      No orders of the defined types were placed in this  encounter.     Follow-up: Return in about 3 months (around 07/28/2022) for a follow-up visit.  Walker Kehr, MD

## 2022-04-27 NOTE — Assessment & Plan Note (Signed)
?  etiology F/u w/Dr Marin Olp Monitoring CBC

## 2022-04-27 NOTE — Assessment & Plan Note (Signed)
On Crestor 3 per wk

## 2022-04-27 NOTE — Patient Instructions (Signed)
Richard [name] , Thank you for taking time to come for your Medicare Wellness Visit. I appreciate your ongoing commitment to your health goals. Please review the following plan we discussed and let me know if I can assist you in the future.   These are the goals we discussed:  Goals      Client understands the importance of follow-up with providers by attending scheduled visits        This is a list of the screening recommended for you and due dates:  Health Maintenance  Topic Date Due   Zoster (Shingles) Vaccine (1 of 2) Never done   DTaP/Tdap/Td vaccine (2 - Td or Tdap) 04/15/2021   COVID-19 Vaccine (6 - 2023-24 season) 01/05/2022   Colon Cancer Screening  11/18/2022   Medicare Annual Wellness Visit  04/27/2023   Pneumonia Vaccine  Completed   Flu Shot  Completed   Hepatitis C Screening: USPSTF Recommendation to screen - Ages 18-73 yo.  Completed   HPV Vaccine  Aged Out    Advanced directives: Yes  Conditions/risks identified: Yes  Next appointment: Follow up in one year for your annual wellness visit.   Preventive Care 73 Years and Older, Male  Preventive care refers to lifestyle choices and visits with your health care provider that can promote health and wellness. What does preventive care include? A yearly physical exam. This is also called an annual well check. Dental exams once or twice a year. Routine eye exams. Ask your health care provider how often you should have your eyes checked. Personal lifestyle choices, including: Daily care of your teeth and gums. Regular physical activity. Eating a healthy diet. Avoiding tobacco and drug use. Limiting alcohol use. Practicing safe sex. Taking low doses of aspirin every day. Taking vitamin and mineral supplements as recommended by your health care provider. What happens during an annual well check? The services and screenings done by your health care provider during your annual well check will depend on your age,  overall health, lifestyle risk factors, and family history of disease. Counseling  Your health care provider may ask you questions about your: Alcohol use. Tobacco use. Drug use. Emotional well-being. Home and relationship well-being. Sexual activity. Eating habits. History of falls. Memory and ability to understand (cognition). Work and work Statistician. Screening  You may have the following tests or measurements: Height, weight, and BMI. Blood pressure. Lipid and cholesterol levels. These may be checked every 5 years, or more frequently if you are over 37 years old. Skin check. Lung cancer screening. You may have this screening every year starting at age 73 if you have a 30-pack-year history of smoking and currently smoke or have quit within the past 15 years. Fecal occult blood test (FOBT) of the stool. You may have this test every year starting at age 73. Flexible sigmoidoscopy or colonoscopy. You may have a sigmoidoscopy every 5 years or a colonoscopy every 10 years starting at age 73. Prostate cancer screening. Recommendations will vary depending on your family history and other risks. Hepatitis C blood test. Hepatitis B blood test. Sexually transmitted disease (STD) testing. Diabetes screening. This is done by checking your blood sugar (glucose) after you have not eaten for a while (fasting). You may have this done every 1-3 years. Abdominal aortic aneurysm (AAA) screening. You may need this if you are a current or former smoker. Osteoporosis. You may be screened starting at age 73 if you are at high risk. Talk with your health care provider  about your test results, treatment options, and if necessary, the need for more tests. Vaccines  Your health care provider may recommend certain vaccines, such as: Influenza vaccine. This is recommended every year. Tetanus, diphtheria, and acellular pertussis (Tdap, Td) vaccine. You may need a Td booster every 10 years. Zoster vaccine.  You may need this after age 73. Pneumococcal 13-valent conjugate (PCV13) vaccine. One dose is recommended after age 73. Pneumococcal polysaccharide (PPSV23) vaccine. One dose is recommended after age 73. Talk to your health care provider about which screenings and vaccines you need and how often you need them. This information is not intended to replace advice given to you by your health care provider. Make sure you discuss any questions you have with your health care provider. Document Released: 03/01/2015 Document Revised: 10/23/2015 Document Reviewed: 12/04/2014 Elsevier Interactive Patient Education  2017 [place] Prevention in the Home Falls can cause injuries. They can happen to people of all ages. There are many things you can do to make your home safe and to help prevent falls. What can I do on the outside of my home? Regularly fix the edges of walkways and driveways and fix any cracks. Remove anything that might make you trip as you walk through a door, such as a raised step or threshold. Trim any bushes or trees on the path to your home. Use bright outdoor lighting. Clear any walking paths of anything that might make someone trip, such as rocks or tools. Regularly check to see if handrails are loose or broken. Make sure that both sides of any steps have handrails. Any raised decks and porches should have guardrails on the edges. Have any leaves, snow, or ice cleared regularly. Use sand or salt on walking paths during winter. Clean up any spills in your garage right away. This includes oil or grease spills. What can I do in the bathroom? Use night lights. Install grab bars by the toilet and in the tub and shower. Do not use towel bars as grab bars. Use non-skid mats or decals in the tub or shower. If you need to sit down in the shower, use a plastic, non-slip stool. Keep the floor dry. Clean up any water that spills on the floor as soon as it happens. Remove soap  buildup in the tub or shower regularly. Attach bath mats securely with double-sided non-slip rug tape. Do not have throw rugs and other things on the floor that can make you trip. What can I do in the bedroom? Use night lights. Make sure that you have a light by your bed that is easy to reach. Do not use any sheets or blankets that are too big for your bed. They should not hang down onto the floor. Have a firm chair that has side arms. You can use this for support while you get dressed. Do not have throw rugs and other things on the floor that can make you trip. What can I do in the kitchen? Clean up any spills right away. Avoid walking on wet floors. Keep items that you use a lot in easy-to-reach places. If you need to reach something above you, use a strong step stool that has a grab bar. Keep electrical cords out of the way. Do not use floor polish or wax that makes floors slippery. If you must use wax, use non-skid floor wax. Do not have throw rugs and other things on the floor that can make you trip. What can I do  with my stairs? Do not leave any items on the stairs. Make sure that there are handrails on both sides of the stairs and use them. Fix handrails that are broken or loose. Make sure that handrails are as long as the stairways. Check any carpeting to make sure that it is firmly attached to the stairs. Fix any carpet that is loose or worn. Avoid having throw rugs at the top or bottom of the stairs. If you do have throw rugs, attach them to the floor with carpet tape. Make sure that you have a light switch at the top of the stairs and the bottom of the stairs. If you do not have them, ask someone to add them for you. What else can I do to help prevent falls? Wear shoes that: Do not have high heels. Have rubber bottoms. Are comfortable and fit you well. Are closed at the toe. Do not wear sandals. If you use a stepladder: Make sure that it is fully opened. Do not climb a closed  stepladder. Make sure that both sides of the stepladder are locked into place. Ask someone to hold it for you, if possible. Clearly mark and make sure that you can see: Any grab bars or handrails. First and last steps. Where the edge of each step is. Use tools that help you move around (mobility aids) if they are needed. These include: Canes. Walkers. Scooters. Crutches. Turn on the lights when you go into a dark area. Replace any light bulbs as soon as they burn out. Set up your furniture so you have a clear path. Avoid moving your furniture around. If any of your floors are uneven, fix them. If there are any pets around you, be aware of where they are. Review your medicines with your doctor. Some medicines can make you feel dizzy. This can increase your chance of falling. Ask your doctor what other things that you can do to help prevent falls. This information is not intended to replace advice given to you by your health care provider. Make sure you discuss any questions you have with your health care provider. Document Released: 11/29/2008 Document Revised: 07/11/2015 Document Reviewed: 03/09/2014 Elsevier Interactive Patient Education  2017 [employer].

## 2022-04-27 NOTE — Progress Notes (Addendum)
Subjective:   Richard [name] is a 72 y.o. male who presents for Medicare Annual/Subsequent preventive examination.  Patient Medicare AWV questionnaire was completed by the patient on 04/23/2022; I have confirmed that all information answered by patient is correct and no changes since this date.     Review of Systems     Cardiac Risk Factors include: advanced age (>74mn, >>3women);dyslipidemia;male gender     Objective:    Today's Vitals   04/27/22 0950 04/27/22 1012  BP: 102/60   Pulse: 73   Temp: 98.3 F (36.8 C)   TempSrc: Temporal   SpO2: 97%   Weight: 208 lb (94.3 kg)   Height: 6' (1.829 m)   PainSc: 0-No pain 0-No pain   Body mass index is 28.21 kg/m.     04/27/2022    9:52 AM 03/30/2022    8:01 AM 04/22/2021    2:26 PM 04/03/2021    9:14 AM 10/01/2020   10:46 AM 04/19/2020    8:23 AM 11/29/2019   11:26 AM  Advanced Directives  Does Patient Have a Medical Advance Directive? Yes No Yes No No Yes No  Type of AParamedicof AGan[place]Living will  Living will;Healthcare Power of Attorney      Does patient want to make changes to medical advance directive?   No - Patient declined   No - Patient declined   Copy of HHiawathain Chart? No - copy requested  No - copy requested      Would patient like information on creating a medical advance directive?  No - Patient declined No - Patient declined No - Patient declined No - Patient declined  No - Patient declined    Current Medications (verified) Outpatient Encounter Medications as of 04/27/2022  Medication Sig   b complex vitamins capsule Take 1 capsule by mouth daily.   Cholecalciferol (VITAMIN D) 2000 UNITS CAPS Take by mouth daily.   doxazosin (CARDURA) 4 MG tablet TAKE 1 TABLET BY MOUTH DAILY ;  GENERIC EQUIVALENT FOR CARDURA   Flaxseed, Linseed, (FLAXSEED OIL PO) Take by mouth. Takes 1400 mg daily   Fluorouracil (TOLAK) 4 % CREA Apply 1 application. topically at bedtime.  Apply topically nightly for 2 weeks   methylPREDNISolone (MEDROL DOSEPAK) 4 MG TBPK tablet As directed   Multiple Vitamin (MULTIVITAMIN) tablet Take 1 tablet by mouth daily.   OVER THE COUNTER MEDICATION Lion's Mane (OTC) take 1 by mouth daily   rosuvastatin (CRESTOR) 5 MG tablet TAKE 1 TABLET BY MOUTH DAILY   Thiamine HCl (VITAMIN B-1 PO) Take by mouth daily.   triamcinolone cream (KENALOG) 0.1 % Apply 1 application topically 2 (two) times daily.   Turmeric (QC TUMERIC COMPLEX PO) Take by mouth.   vitamin C (ASCORBIC ACID) 500 MG tablet Take 500 mg by mouth daily.   No facility-administered encounter medications on file as of 04/27/2022.    Allergies (verified) Aspirin   History: Past Medical History:  Diagnosis Date   Basal cell carcinoma 10/24/2020   left ant neck bcc mohs   BPH (benign prostatic hyperplasia)    Hypertension    Hypogonadism male    Mononucleosis    Mumps    Plantar fasciitis    PONV (postoperative nausea and vomiting)    Squamous cell carcinoma of skin 10/24/2020   mod forehead and right temple   Zoster    Past Surgical History:  Procedure Laterality Date   circumsicion  2007  asa adult for balanitis w/ phimosis   COLONOSCOPY  2013   HERNIA REPAIR Left    INGUINAL HERNIA REPAIR  childhood   left   LIPOMA EXCISION N/A 02/19/2016   Procedure: EXCISION SUBCUTANEOUS LIPOMA BACK;  Surgeon: Donnie Mesa, MD;  Location: [facility];  Service: General;  Laterality: N/A;   MOUTH SURGERY     early eruption of incisors requiring surgical extraction   POLYPECTOMY     [place]   Family History  Problem Relation Age of Onset   Arthritis Mother        spinal stenosis s/p surgery   Diabetes Neg Hx    Heart disease Neg Hx    Colon cancer Neg Hx    Stomach cancer Neg Hx    Colon polyps Neg Hx    Rectal cancer Neg Hx    Social History   Socioeconomic History   Marital status: Married    Spouse  name: Not on file   Number of children: 3   Years of education: 16   Highest education level: Not on file  Occupational History   Occupation: sales  Tobacco Use   Smoking status: Former    Types: Cigarettes    Quit date: 04/16/1967    Years since quitting: 55.0   Smokeless tobacco: Never  Vaping Use   Vaping Use: Never used  Substance and Sexual Activity   Alcohol use: No   Drug use: No   Sexual activity: Yes    Partners: Female  Other Topics Concern   Not on file  Social History Narrative   HSG, [place] - [place]. Married '75. 2 sons - '71(adopted), '76; 1 dtr (adopted) '73; 4 grandchildren.   Work - Actuary, currently Garment/textile technologist. Marriage is in good health. ACP - HCPOA - wife and son; yes - CPR; yes for acute care; no prolonged futile or heroic measures.      JEHOVAH'S WITNESS - OK for cell salvage, volume expanded, immunoglobulins. Solid organ transplant - ????   Social Determinants of Health   Financial Resource Strain: Low Risk  (04/27/2022)   Overall Financial Resource Strain (CARDIA)    Difficulty of Paying Living Expenses: Not hard at all  Food Insecurity: No Food Insecurity (04/27/2022)   Hunger Vital Sign    Worried About Running Out of Food in the Last Year: Never true    Ran Out of Food in the Last Year: Never true  Transportation Needs: No Transportation Needs (04/27/2022)   PRAPARE - Hydrologist (Medical): No    Lack of Transportation (Non-Medical): No  Physical Activity: Insufficiently Active (04/27/2022)   Exercise Vital Sign    Days of Exercise per Week: 3 days    Minutes of Exercise per Session: 30 min  Stress: No Stress Concern Present (04/27/2022)   Richard [name]    Feeling of Stress : Not at all  Social Connections: Unknown (04/27/2022)   Social Connection and Isolation Panel [NHANES]    Frequency of Communication with Friends and Family: More than  three times a week    Frequency of Social Gatherings with Friends and Family: More than three times a week    Attends Religious Services: Not on file    Active Member of Clubs or Organizations: Yes    Attends Archivist Meetings: More than 4 times per year  Marital Status: Married    Tobacco Counseling Counseling given: Not Answered   Clinical Intake:  Pre-visit preparation completed: Yes  Pain : No/denies pain Pain Score: 0-No pain     BMI - recorded: 28.21 Nutritional Status: BMI 25 -29 Overweight Nutritional Risks: None Diabetes: No  How often do you need to have someone help you when you read instructions, pamphlets, or other written materials from your doctor or pharmacy?: 1 - Never What is the last grade level you completed in school?: College Degree  Diabetic? no  Interpreter Needed?: No  Information entered by :: Lisette Abu, LPN.   Activities of Daily Living    04/27/2022   10:08 AM 04/23/2022    9:25 AM  In your present state of health, do you have any difficulty performing the following activities:  Hearing? 0 0  Vision? 0 0  Difficulty concentrating or making decisions? 0 0  Walking or climbing stairs? 0 0  Dressing or bathing? 0 0  Doing errands, shopping? 0 0  Preparing Food and eating ? N N  Using the Toilet? N N  In the past six months, have you accidently leaked urine? N N  Do you have problems with loss of bowel control? N N  Managing your Medications? N N  Managing your Finances? N N  Housekeeping or managing your Housekeeping? N N    Patient Care Team: Plotnikov, Evie Lacks, MD as PCP - General (Internal Medicine) Warren Danes, PA-C as Physician Assistant (Dermatology) Marygrace Drought, MD as Consulting Physician (Ophthalmology)  Indicate any recent Medical Services you may have received from other than [facility] providers in the past year (date may be approximate).     Assessment:   This is a routine wellness  examination for [place].  Hearing/Vision screen Hearing Screening - Comments:: Patient denied any hearing difficulty.   No hearing aids.  Vision Screening - Comments:: Wears rx glasses - up to date with routine eye exams with Marygrace Drought, MD.   Dietary issues and exercise activities discussed: Current Exercise Habits: Home exercise routine, Type of exercise: walking;Other - see comments (YARD WORK, BICYCLING), Time (Minutes): 30, Frequency (Times/Week): 3, Weekly Exercise (Minutes/Week): 90, Intensity: Moderate, Exercise limited by: None identified   Goals Addressed             This Visit's Progress    Client understands the importance of follow-up with providers by attending scheduled visits        Depression Screen    04/27/2022   10:09 AM 10/27/2021    9:12 AM 06/25/2021   10:38 AM 04/22/2021    2:32 PM 04/19/2020    8:39 AM 10/30/2019    8:14 AM 10/25/2017    9:22 AM  PHQ 2/9 Scores  PHQ - 2 Score 0   0 0 0 0 0 0 0  PHQ- 9 Score 0   0 0         Fall Risk    04/27/2022   10:09 AM 04/23/2022    9:25 AM 10/27/2021    9:12 AM 06/25/2021   10:38 AM 04/22/2021    2:28 PM  Fall Risk   Falls in the past year? 0   0 0 0 0 0  Number falls in past yr: 0   0  0 0 0  Injury with Fall? 0   0  0 0 0  Risk for fall due to : No Fall Risks   No Fall Risks  No Fall Risks  No Fall Risks No Fall Risks  Follow up Falls evaluation completed   Falls evaluation completed   Falls evaluation completed Falls evaluation completed    [place]:  Any stairs in or around the home? No  If so, are there any without handrails? No  Home free of loose throw rugs in walkways, pet beds, electrical cords, etc? Yes  Adequate lighting in your home to reduce risk of falls? Yes   ASSISTIVE DEVICES UTILIZED TO PREVENT FALLS:  Life alert? No  Use of a cane, walker or w/c? No  Grab bars in the bathroom? Yes  Shower chair or bench in shower? Yes  Elevated toilet seat or  a handicapped toilet? Yes   TIMED UP AND GO:  Was the test performed? Yes .  Length of time to ambulate 10 feet: 8 sec.   Gait steady and fast without use of assistive device  Cognitive Function:        04/27/2022   10:07 AM  6CIT Screen  What Year? 0 points  What month? 0 points  What time? 0 points  Count back from 20 0 points  Months in reverse 0 points  Repeat phrase 0 points  Total Score 0 points    Immunizations Immunization History  Administered Date(s) Administered   COVID-19, mRNA, vaccine(Comirnaty)12 years and older 11/10/2021   Fluad Quad(high Dose 65+) 10/21/2018, 10/21/2018, 10/18/2019, 10/27/2021   Influenza Whole 12/16/2007, 11/13/2008, 01/18/2012   Influenza, High Dose Seasonal PF 10/18/2015, 10/25/2017   Influenza,inj,Quad PF,6+ Mos 11/14/2014, 10/23/2016   PFIZER(Purple Top)SARS-COV-2 Vaccination 03/09/2019, 03/30/2019, 11/21/2019, 05/24/2020   PNEUMOCOCCAL CONJUGATE-20 08/18/2021   Pneumococcal Conjugate-13 08/06/2015   Pneumococcal Polysaccharide-23 04/16/2011   Tdap 04/16/2011   Zoster, Live 09/26/2011    TDAP status: Due, Education has been provided regarding the importance of this vaccine. Advised may receive this vaccine at local pharmacy or Health Dept. Aware to provide a copy of the vaccination record if obtained from local pharmacy or Health Dept. Verbalized acceptance and understanding.  Flu Vaccine status: Up to date  Pneumococcal vaccine status: Up to date  Covid-19 vaccine status: Completed vaccines  Qualifies for Shingles Vaccine? Yes   Zostavax completed Yes   Shingrix Completed?: No.    Education has been provided regarding the importance of this vaccine. Patient has been advised to call insurance company to determine out of pocket expense if they have not yet received this vaccine. Advised may also receive vaccine at local pharmacy or Health Dept. Verbalized acceptance and understanding.  Screening Tests Health Maintenance   Topic Date Due   Zoster Vaccines- Shingrix (1 of 2) Never done   DTaP/Tdap/Td (2 - Td or Tdap) 04/15/2021   COVID-19 Vaccine (6 - 2023-24 season) 01/05/2022   COLONOSCOPY (Pts 45-101yr Insurance coverage will need to be confirmed)  11/18/2022   Medicare Annual Wellness (AWV)  04/27/2023   Pneumonia Vaccine 72 Years old  Completed   INFLUENZA VACCINE  Completed   Hepatitis C Screening  Completed   HPV VACCINES  Aged Out    Health Maintenance  Health Maintenance Due  Topic Date Due   Zoster Vaccines- Shingrix (1 of 2) Never done   DTaP/Tdap/Td (2 - Td or Tdap) 04/15/2021   COVID-19 Vaccine (6 - 2023-24 season) 01/05/2022    Colorectal cancer screening: Type of screening: Colonoscopy. Completed 11/17/2017. Repeat every 5 years  Lung Cancer Screening: (Low Dose CT Chest recommended if Age 73-80years, 30 pack-year currently smoking OR have  quit w/in 15years.) does not qualify.   Lung Cancer Screening Referral: no  Additional Screening:  Hepatitis C Screening: does qualify; Completed 08/06/2015  Vision Screening: Recommended annual ophthalmology exams for early detection of glaucoma and other disorders of the eye. Is the patient up to date with their annual eye exam?  Yes  Who is the provider or what is the name of the office in which the patient attends annual eye exams? Marygrace Drought, MD. If pt is not established with a provider, would they like to be referred to a provider to establish care? No .   Dental Screening: Recommended annual dental exams for proper oral hygiene  Community Resource Referral / Chronic Care Management: CRR required this visit?  No   CCM required this visit?  No      Plan:     I have personally reviewed and noted the following in the patient's chart:   Medical and social history Use of alcohol, tobacco or illicit drugs  Current medications and supplements including opioid prescriptions. Patient is not currently taking opioid  prescriptions. Functional ability and status Nutritional status Physical activity Advanced directives List of other physicians Hospitalizations, surgeries, and ER visits in previous 12 months Vitals Screenings to include cognitive, depression, and falls Referrals and appointments  In addition, I have reviewed and discussed with patient certain preventive protocols, quality metrics, and best practice recommendations. A written personalized care plan for preventive services as well as general preventive health recommendations were provided to patient.     Sheral Flow, LPN   [id number]   Nurse Notes:  Normal cognitive status assessed by direct observation by this Nurse Health Advisor. No abnormalities found.   Patient Medicare AWV questionnaire was completed by the patient on 04/23/2022; I have confirmed that all information answered by patient is correct and no changes since this date.    Medical screening examination/treatment/procedure(s) were performed by non-physician practitioner and as supervising physician I was immediately available for consultation/collaboration.  I agree with above. Lew Dawes, MD

## 2022-04-27 NOTE — Assessment & Plan Note (Signed)
Crestor 3 per wk

## 2022-04-28 ENCOUNTER — Ambulatory Visit: Payer: Medicare Other | Admitting: Physician Assistant

## 2022-05-12 DIAGNOSIS — H5203 Hypermetropia, bilateral: Secondary | ICD-10-CM | POA: Diagnosis not present

## 2022-05-12 DIAGNOSIS — H2513 Age-related nuclear cataract, bilateral: Secondary | ICD-10-CM | POA: Diagnosis not present

## 2022-06-21 ENCOUNTER — Encounter: Payer: Self-pay | Admitting: Internal Medicine

## 2022-10-01 ENCOUNTER — Encounter (INDEPENDENT_AMBULATORY_CARE_PROVIDER_SITE_OTHER): Payer: Self-pay

## 2022-10-23 ENCOUNTER — Other Ambulatory Visit (INDEPENDENT_AMBULATORY_CARE_PROVIDER_SITE_OTHER): Payer: Medicare Other

## 2022-10-23 DIAGNOSIS — I2510 Atherosclerotic heart disease of native coronary artery without angina pectoris: Secondary | ICD-10-CM

## 2022-10-23 DIAGNOSIS — I2583 Coronary atherosclerosis due to lipid rich plaque: Secondary | ICD-10-CM | POA: Diagnosis not present

## 2022-10-23 DIAGNOSIS — N400 Enlarged prostate without lower urinary tract symptoms: Secondary | ICD-10-CM | POA: Diagnosis not present

## 2022-10-23 DIAGNOSIS — E785 Hyperlipidemia, unspecified: Secondary | ICD-10-CM

## 2022-10-23 LAB — URINALYSIS
Bilirubin Urine: NEGATIVE
Hgb urine dipstick: NEGATIVE
Ketones, ur: NEGATIVE
Leukocytes,Ua: NEGATIVE
Nitrite: NEGATIVE
Specific Gravity, Urine: 1.025 (ref 1.000–1.030)
Total Protein, Urine: NEGATIVE
Urine Glucose: NEGATIVE
Urobilinogen, UA: 0.2 (ref 0.0–1.0)
pH: 6 (ref 5.0–8.0)

## 2022-10-23 LAB — LIPID PANEL
Cholesterol: 95 mg/dL (ref 0–200)
HDL: 37.7 mg/dL — ABNORMAL LOW (ref >39.00)
LDL Cholesterol: 31 mg/dL (ref 0–99)
NonHDL: 57.06
Total CHOL/HDL Ratio: 3
Triglycerides: 132 mg/dL (ref 0.0–149.0)
VLDL: 26.4 mg/dL (ref 0.0–40.0)

## 2022-10-23 LAB — PSA: PSA: 1.09 ng/mL (ref 0.10–4.00)

## 2022-10-23 LAB — TSH: TSH: 2 u[IU]/mL (ref 0.35–5.50)

## 2022-10-28 ENCOUNTER — Ambulatory Visit (INDEPENDENT_AMBULATORY_CARE_PROVIDER_SITE_OTHER): Payer: Medicare Other | Admitting: Internal Medicine

## 2022-10-28 ENCOUNTER — Encounter: Payer: Self-pay | Admitting: Internal Medicine

## 2022-10-28 VITALS — BP 108/70 | HR 68 | Temp 97.9°F | Ht 72.0 in | Wt 209.4 lb

## 2022-10-28 DIAGNOSIS — I2583 Coronary atherosclerosis due to lipid rich plaque: Secondary | ICD-10-CM

## 2022-10-28 DIAGNOSIS — R55 Syncope and collapse: Secondary | ICD-10-CM

## 2022-10-28 DIAGNOSIS — I2510 Atherosclerotic heart disease of native coronary artery without angina pectoris: Secondary | ICD-10-CM | POA: Diagnosis not present

## 2022-10-28 DIAGNOSIS — Z23 Encounter for immunization: Secondary | ICD-10-CM

## 2022-10-28 NOTE — Assessment & Plan Note (Signed)
On Crestor 3 per wk

## 2022-10-28 NOTE — Progress Notes (Signed)
Subjective:  Patient ID: Richard [name], male    DOB: [birth year]  Age: 72 y.o. MRN: [id number]  CC: No chief complaint on file.   HPI Richard [name] presents for a  syncopal spell at the dentist's office 1 month ago - he had 3 teeth removed; EMS was called  Outpatient Medications Prior to Visit  Medication Sig Dispense Refill   b complex vitamins capsule Take 1 capsule by mouth daily.     Cholecalciferol (VITAMIN D) 2000 UNITS CAPS Take by mouth daily.     doxazosin (CARDURA) 4 MG tablet TAKE 1 TABLET BY MOUTH DAILY ;  GENERIC EQUIVALENT FOR CARDURA 100 tablet 3   Flaxseed, Linseed, (FLAXSEED OIL PO) Take by mouth. Takes 1400 mg daily     Fluorouracil (TOLAK) 4 % CREA Apply 1 application. topically at bedtime. Apply topically nightly for 2 weeks 40 g 0   methylPREDNISolone (MEDROL DOSEPAK) 4 MG TBPK tablet As directed 21 tablet 0   Multiple Vitamin (MULTIVITAMIN) tablet Take 1 tablet by mouth daily.     OVER THE COUNTER MEDICATION Lion's Mane (OTC) take 1 by mouth daily     rosuvastatin (CRESTOR) 5 MG tablet TAKE 1 TABLET BY MOUTH DAILY 100 tablet 3   Thiamine HCl (VITAMIN B-1 PO) Take by mouth daily.     triamcinolone cream (KENALOG) 0.1 % Apply 1 application topically 2 (two) times daily. 45 g 0   Turmeric (QC TUMERIC COMPLEX PO) Take by mouth.     vitamin C (ASCORBIC ACID) 500 MG tablet Take 500 mg by mouth daily.     No facility-administered medications prior to visit.    ROS: Review of Systems  Constitutional:  Negative for appetite change, fatigue and unexpected weight change.  HENT:  Negative for congestion, nosebleeds, sneezing, sore throat and trouble swallowing.   Eyes:  Negative for itching and visual disturbance.  Respiratory:  Negative for cough.   Cardiovascular:  Negative for chest pain, palpitations and leg swelling.  Gastrointestinal:  Negative for abdominal distention, blood in stool, diarrhea and nausea.  Genitourinary:  Negative for frequency and  hematuria.  Musculoskeletal:  Negative for back pain, gait problem, joint swelling and neck pain.  Skin:  Negative for rash.  Neurological:  Negative for dizziness, tremors, speech difficulty and weakness.  Psychiatric/Behavioral:  Negative for agitation, dysphoric mood and sleep disturbance. The patient is not nervous/anxious.     Objective:  BP 108/70 (BP Location: Left Arm, Patient Position: Sitting, Cuff Size: Normal)   Pulse 68   Temp 97.9 F (36.6 C) (Oral)   Ht 6' (1.829 m)   Wt 209 lb 6.4 oz (95 kg)   SpO2 97%   BMI 28.40 kg/m   BP Readings from Last 3 Encounters:  10/28/22 108/70  04/27/22 102/60  03/30/22 96/67    Wt Readings from Last 3 Encounters:  10/28/22 209 lb 6.4 oz (95 kg)  04/27/22 208 lb (94.3 kg)  03/30/22 205 lb (93 kg)    Physical Exam Constitutional:      General: He is not in acute distress.    Appearance: Normal appearance. He is well-developed.     Comments: NAD  Eyes:     Conjunctiva/sclera: Conjunctivae normal.     Pupils: Pupils are equal, round, and reactive to light.  Neck:     Thyroid: No thyromegaly.     Vascular: No JVD.  Cardiovascular:     Rate and Rhythm: Normal rate and regular rhythm.  Heart sounds: Normal heart sounds. No murmur heard.    No friction rub. No gallop.  Pulmonary:     Effort: Pulmonary effort is normal. No respiratory distress.     Breath sounds: Normal breath sounds. No wheezing or rales.  Chest:     Chest wall: No tenderness.  Abdominal:     General: Bowel sounds are normal. There is no distension.     Palpations: Abdomen is soft. There is no mass.     Tenderness: There is no abdominal tenderness. There is no guarding or rebound.  Musculoskeletal:        General: No tenderness. Normal range of motion.     Cervical back: Normal range of motion.  Lymphadenopathy:     Cervical: No cervical adenopathy.  Skin:    General: Skin is warm and dry.     Findings: No rash.  Neurological:     Mental Status:  He is alert and oriented to person, place, and time.     Cranial Nerves: No cranial nerve deficit.     Motor: No abnormal muscle tone.     Coordination: Coordination normal.     Gait: Gait normal.     Deep Tendon Reflexes: Reflexes are normal and symmetric.  Psychiatric:        Behavior: Behavior normal.        Thought Content: Thought content normal.        Judgment: Judgment normal.    Procedure: EKG Indication: syncope 4 weeks ago Impression: S brady. VR 54 bpm. No acute changes.   Lab Results  Component Value Date   WBC 4.2 03/30/2022   HGB 12.6 (L) 03/30/2022   HCT 35.4 (L) 03/30/2022   PLT 131 (L) 03/30/2022   GLUCOSE 106 (H) 03/30/2022   CHOL 95 10/23/2022   TRIG 132.0 10/23/2022   HDL 37.70 (L) 10/23/2022   LDLDIRECT 80.9 04/16/2011   LDLCALC 31 10/23/2022   ALT 21 03/30/2022   AST 19 03/30/2022   NA 136 03/30/2022   K 4.1 03/30/2022   CL 107 03/30/2022   CREATININE 1.25 (H) 03/30/2022   BUN 25 (H) 03/30/2022   CO2 25 03/30/2022   TSH 2.00 10/23/2022   PSA 1.09 10/23/2022   HGBA1C 5.1 10/27/2018    MR Lumbar Spine Wo Contrast  Result Date: 08/29/2021 CLINICAL DATA:  Right side low back pain and right leg weakness for 6-9 months. No known injury. EXAM: MRI LUMBAR SPINE WITHOUT CONTRAST TECHNIQUE: Multiplanar, multisequence MR imaging of the lumbar spine was performed. No intravenous contrast was administered. COMPARISON:  Plain films lumbar spine 06/26/2019. FINDINGS: Segmentation: On the prior plain films, the patient appears to have transitional lumbosacral anatomy. On this study, the lowest level imaged in the axial plane which is also the last fully open disc space is labeled L5-S1. Alignment:  Maintained. Vertebrae:  No fracture, evidence of discitis, or bone lesion. Conus medullaris and cauda equina: Conus extends to the L1 level. Conus and cauda equina appear normal. Paraspinal and other soft tissues: Negative. Disc levels: T12-L1: Shallow central protrusion  without stenosis. L1-2: Negative. L2-3: Mild-to-moderate facet degenerative disease and a shallow disc bulge. There is mild central canal narrowing. The foramina are open. L3-4: Moderate facet arthropathy, shallow disc bulge and ligamentum flavum thickening. There is moderate central canal stenosis with some narrowing in the subarticular recesses. The foramina mildly narrowed. L4-5: There is a shallow disc bulge with a superimposed small, down turning right lateral recess disc protrusion. Mild to moderate  facet arthropathy and ligamentum flavum thickening are seen. Moderate central canal stenosis is present and there is narrowing in the right subarticular recess with encroachment on the descending right L5 root. Mild bilateral foraminal narrowing is seen. L5-S1: Negative. IMPRESSION: Transitional lumbosacral anatomy. Please see numbering scheme above and correlate with plain films if any intervention is planned. Disc bulge with a small down turning right subarticular recess protrusion at L4-5 results in impingement on the descending right L5 root and moderate central canal stenosis. Mild bilateral foraminal narrowing is also seen at L4-5. Moderate central canal stenosis at L3-4 where there is also mild bilateral foraminal narrowing. Electronically Signed   By: Drusilla Kanner M.D.   On: 08/29/2021 08:39    Assessment & Plan:   Problem List Items Addressed This Visit     Coronary atherosclerosis    On Crestor 3 per wk       Syncope    A syncopal spell at the dentist's office 1 month ago - he had 3 teeth removed; EMS was called H/o syncope as a child EKG S brady VR 54 Hydrate well      Relevant Orders   EKG 12-Lead   Other Visit Diagnoses     Need for vaccination    -  Primary   Relevant Orders   Flu Vaccine Trivalent High Dose (Fluad) (Completed)         No orders of the defined types were placed in this encounter.     Follow-up: Return in about 6 months (around 04/27/2023) for a  follow-up visit.  Sonda Primes, MD

## 2022-10-28 NOTE — Assessment & Plan Note (Addendum)
A syncopal spell at the dentist's office 1 month ago - he had 3 teeth removed; EMS was called H/o syncope as a child EKG S brady VR 54 Hydrate well

## 2022-11-25 ENCOUNTER — Encounter: Payer: Self-pay | Admitting: Gastroenterology

## 2022-12-11 ENCOUNTER — Encounter: Payer: Self-pay | Admitting: Gastroenterology

## 2022-12-28 ENCOUNTER — Other Ambulatory Visit: Payer: Self-pay | Admitting: Internal Medicine

## 2023-01-12 ENCOUNTER — Encounter: Payer: Self-pay | Admitting: Gastroenterology

## 2023-01-12 ENCOUNTER — Ambulatory Visit (AMBULATORY_SURGERY_CENTER): Payer: Medicare Other

## 2023-01-12 VITALS — Ht 72.0 in | Wt 210.0 lb

## 2023-01-12 DIAGNOSIS — Z860100 Personal history of colon polyps, unspecified: Secondary | ICD-10-CM

## 2023-01-12 MED ORDER — NA SULFATE-K SULFATE-MG SULF 17.5-3.13-1.6 GM/177ML PO SOLN: 1.0000 | Freq: Once | ORAL | 0 refills | Status: AC

## 2023-01-12 NOTE — Progress Notes (Signed)
No egg or soy allergy known to patient  No issues known to pt with past sedation with any surgeries or procedures Patient denies ever being told they had issues or difficulty with intubation  No FH of Malignant Hyperthermia Pt is not on diet pills Pt is not on  home 02  Pt is not on blood thinners  Pt denies issues with constipation  No A fib or A flutter Have any cardiac testing pending--no  LOA: independent  Prep: 2 day suprep   Patient's chart reviewed by Cathlyn Parsons CNRA prior to previsit and patient appropriate for the [facility].  Previsit completed and red dot placed by patient's name on their procedure day (on provider's schedule).     PV competed with patient. Prep instructions sent via mychart and home address. Goodrx coupon for [employer] provided to use for price reduction if needed.

## 2023-01-25 ENCOUNTER — Encounter: Payer: Self-pay | Admitting: Gastroenterology

## 2023-01-25 ENCOUNTER — Ambulatory Visit (AMBULATORY_SURGERY_CENTER): Payer: Medicare Other | Admitting: Gastroenterology

## 2023-01-25 VITALS — BP 107/65 | HR 53 | Temp 97.1°F | Resp 12 | Ht 72.0 in | Wt 210.0 lb

## 2023-01-25 DIAGNOSIS — Z1211 Encounter for screening for malignant neoplasm of colon: Secondary | ICD-10-CM

## 2023-01-25 DIAGNOSIS — K5520 Angiodysplasia of colon without hemorrhage: Secondary | ICD-10-CM

## 2023-01-25 DIAGNOSIS — K5730 Diverticulosis of large intestine without perforation or abscess without bleeding: Secondary | ICD-10-CM

## 2023-01-25 DIAGNOSIS — Z860101 Personal history of adenomatous and serrated colon polyps: Secondary | ICD-10-CM

## 2023-01-25 DIAGNOSIS — K640 First degree hemorrhoids: Secondary | ICD-10-CM

## 2023-01-25 MED ORDER — SODIUM CHLORIDE 0.9 % IV SOLN: 500.0000 mL | Freq: Once | INTRAVENOUS | Status: DC

## 2023-01-25 NOTE — Patient Instructions (Addendum)
Recommendation:- Patient has a contact number available for                            emergencies. The signs and symptoms of potential                            delayed complications were discussed with the                            patient. Return to normal activities tomorrow.                            Written discharge instructions were provided to the                            patient.                           - Resume previous diet.                           - Continue present medications.  Handouts on diverticulosis and hemorrhoids given.      Document Information    YOU HAD AN ENDOSCOPIC PROCEDURE TODAY AT THE [facility]:   Refer to the procedure report that was given to you for any specific questions about what was found during the examination.  If the procedure report does not answer your questions, please call your gastroenterologist to clarify.  If you requested that your care partner not be given the details of your procedure findings, then the procedure report has been included in a sealed envelope for you to review at your convenience later.  YOU SHOULD EXPECT: Some feelings of bloating in the abdomen. Passage of more gas than usual.  Walking can help get rid of the air that was put into your GI tract during the procedure and reduce the bloating. If you had a lower endoscopy (such as a colonoscopy or flexible sigmoidoscopy) you may notice spotting of blood in your stool or on the toilet paper. If you underwent a bowel prep for your procedure, you may not have a normal bowel movement for a few days.  Please Note:  You might notice some irritation and congestion in your nose or some drainage.  This is from the oxygen used during your procedure.  There is no need for concern and it should clear up in a day or so.  SYMPTOMS TO REPORT IMMEDIATELY:  Following lower endoscopy (colonoscopy or flexible sigmoidoscopy):  Excessive amounts of blood in the  stool  Significant tenderness or worsening of abdominal pains  Swelling of the abdomen that is new, acute  Fever of 100F or higher   For urgent or emergent issues, a gastroenterologist can be reached at any hour by calling [phone]. Do not use MyChart messaging for urgent concerns.    DIET:  We do recommend a small meal at first, but then you may proceed to your regular diet.  Drink plenty of fluids but you should avoid alcoholic beverages for 24 hours.  ACTIVITY:  You should plan to take it easy for the rest of today and you should NOT DRIVE or use heavy machinery until tomorrow (because  of the sedation medicines used during the test).    FOLLOW UP: Our staff will call the number listed on your records the next business day following your procedure.  We will call around 7:15- 8:00 am to check on you and address any questions or concerns that you may have regarding the information given to you following your procedure. If we do not reach you, we will leave a message.     If any biopsies were taken you will be contacted by phone or by letter within the next 1-3 weeks.  Please call us at [phone] if you have not heard about the biopsies in 3 weeks.    SIGNATURES/CONFIDENTIALITY: You and/or your care partner have signed paperwork which will be entered into your electronic medical record.  These signatures attest to the fact that that the information above on your After Visit Summary has been reviewed and is understood.  Full responsibility of the confidentiality of this discharge information lies with you and/or your care-partner.

## 2023-01-25 NOTE — Op Note (Signed)
[facility] Patient Name: Richard [name] Procedure Date: 01/25/2023 8:03 AM MRN: [id number] Endoscopist: Meryl Dare , MD, [phone] Age: 72 Referring MD:  Date of Birth: 03/07/1950 Gender: Male Account #: [id number] Procedure:                Colonoscopy Indications:              Surveillance: Personal history of adenomatous                            polyps on last colonoscopy > 5 years ago Medicines:                Monitored Anesthesia Care Procedure:                Pre-Anesthesia Assessment:                           - Prior to the procedure, a History and Physical                            was performed, and patient medications and                            allergies were reviewed. The patient's tolerance of                            previous anesthesia was also reviewed. The risks                            and benefits of the procedure and the sedation                            options and risks were discussed with the patient.                            All questions were answered, and informed consent                            was obtained. Prior Anticoagulants: The patient has                            taken no anticoagulant or antiplatelet agents. ASA                            Grade Assessment: II - A patient with mild systemic                            disease. After reviewing the risks and benefits,                            the patient was deemed in satisfactory condition to                            undergo the procedure.  After obtaining informed consent, the colonoscope                            was passed under direct vision. Throughout the                            procedure, the patient's blood pressure, pulse, and                            oxygen saturations were monitored continuously. The                            Olympus Scope SN [id number] was introduced through the                            anus and advanced  to the the cecum, identified by                            appendiceal orifice and ileocecal valve. The                            ileocecal valve, appendiceal orifice, and rectum                            were photographed. The quality of the bowel                            preparation was excellent. The colonoscopy was                            performed without difficulty. The patient tolerated                            the procedure well. Scope In: 8:08:30 AM Scope Out: 8:20:09 AM Scope Withdrawal Time: 0 hours 8 minutes 29 seconds  Total Procedure Duration: 0 hours 11 minutes 39 seconds  Findings:                 The perianal and digital rectal examinations were                            normal.                           Multiple medium-mouthed and small-mouthed                            diverticula were found in the sigmoid colon,                            descending colon and transverse colon. There was no                            evidence of diverticular bleeding.  A single small localized angioectasia without                            bleeding was found in the cecum.                           External and internal hemorrhoids were found during                            retroflexion. The hemorrhoids were small and Grade                            I (internal hemorrhoids that do not prolapse).                           The exam was otherwise without abnormality on                            direct and retroflexion views. Complications:            No immediate complications. Estimated blood loss:                            None. Estimated Blood Loss:     Estimated blood loss: none. Impression:               - Mild diverticulosis in the sigmoid colon, in the                            descending colon and in the transverse colon.                           - A single non-bleeding colonic angioectasia.                           - External and  internal hemorrhoids.                           - The examination was otherwise normal on direct                            and retroflexion views.                           - No specimens collected. Recommendation:           - Patient has a contact number available for                            emergencies. The signs and symptoms of potential                            delayed complications were discussed with the                            patient. Return to normal activities tomorrow.  Written discharge instructions were provided to the                            patient.                           - Resume previous diet.                           - Continue present medications. Meryl Dare, MD 01/25/2023 8:26:12 AM This report has been signed electronically.

## 2023-01-25 NOTE — Progress Notes (Signed)
Report to PACU, RN, vss, BBS= Clear.  

## 2023-01-25 NOTE — Progress Notes (Signed)
History & Physical  Primary Care Physician:  Tresa Garter, MD Primary Gastroenterologist: Claudette Head, MD  Impression / Plan:  Personal history of adenomatous colon polyps for surveillance colonoscopy.    CHIEF COMPLAINT:  Personal history of colon polyps   HPI: Richard [name] is a 72 y.o. male with a personal history of adenomatous colon polyps for surveillance colonoscopy.    Past Medical History:  Diagnosis Date   Basal cell carcinoma 10/24/2020   left ant neck [facility]   BPH (benign prostatic hyperplasia)    Hypertension    Hypogonadism male    Mononucleosis    Mumps    Plantar fasciitis    PONV (postoperative nausea and vomiting)    Squamous cell carcinoma of skin 10/24/2020   mod forehead and right temple   Zoster     Past Surgical History:  Procedure Laterality Date   circumsicion  2007   asa adult for balanitis w/ phimosis   COLONOSCOPY  2013   HERNIA REPAIR Left    INGUINAL HERNIA REPAIR  childhood   left   LIPOMA EXCISION N/A 02/19/2016   Procedure: EXCISION SUBCUTANEOUS LIPOMA BACK;  Surgeon: Manus Rudd, MD;  Location: [facility];  Service: General;  Laterality: N/A;   MOUTH SURGERY     early eruption of incisors requiring surgical extraction   POLYPECTOMY     TONSILLECTOMY  1959   WISDOM TOOTH EXTRACTION  1990    Prior to Admission medications   Medication Sig Start Date End Date Taking? Authorizing Provider  b complex vitamins capsule Take 1 capsule by mouth daily.   Yes [provider]  Cholecalciferol (VITAMIN D) 2000 UNITS CAPS Take by mouth daily.   Yes [provider]  doxazosin (CARDURA) 4 MG tablet TAKE 1 TABLET BY MOUTH DAILY  GENERIC EQUIVALENT FOR CARDURA 12/28/22  Yes Plotnikov, Georgina Quint, MD  Flaxseed, Linseed, (FLAXSEED OIL PO) Take by mouth. Takes 1400 mg daily   Yes [provider]  methylPREDNISolone (MEDROL DOSEPAK) 4 MG TBPK tablet As directed 08/18/21  Yes Plotnikov, Georgina Quint, MD  Multiple Vitamin (MULTIVITAMIN) tablet Take 1 tablet by mouth daily.   Yes [provider]  OVER THE COUNTER MEDICATION [place]ane (OTC) take 1 by mouth daily   Yes [provider]  rosuvastatin (CRESTOR) 5 MG tablet TAKE 1 TABLET BY MOUTH DAILY 12/28/22  Yes Plotnikov, Georgina Quint, MD  Thiamine HCl (VITAMIN B-1 PO) Take by mouth daily.   Yes [provider]  Turmeric (QC TUMERIC COMPLEX PO) Take by mouth.   Yes [provider]  vitamin C (ASCORBIC ACID) 500 MG tablet Take 500 mg by mouth daily.   Yes [provider]  Fluorouracil (TOLAK) 4 % CREA Apply 1 application. topically at bedtime. Apply topically nightly for 2 weeks Patient not taking: Reported on 01/12/2023 04/23/21   Mackey Birchwood R, PA-C  triamcinolone cream (KENALOG) 0.1 % Apply 1 application topically 2 (two) times daily. Patient not taking: Reported on 01/12/2023 07/04/19   Plotnikov, Georgina Quint, MD    Current Outpatient Medications  Medication Sig Dispense Refill   b complex vitamins capsule Take 1 capsule by mouth daily.     Cholecalciferol (VITAMIN D) 2000 UNITS CAPS Take by mouth daily.     doxazosin (CARDURA) 4 MG tablet TAKE 1 TABLET BY MOUTH DAILY  GENERIC EQUIVALENT FOR CARDURA 100 tablet 2   Flaxseed, Linseed, (FLAXSEED OIL PO) Take by mouth. Takes 1400 mg daily  methylPREDNISolone (MEDROL DOSEPAK) 4 MG TBPK tablet As directed 21 tablet 0   Multiple Vitamin (MULTIVITAMIN) tablet Take 1 tablet by mouth daily.     OVER THE COUNTER MEDICATION Lion's Mane (OTC) take 1 by mouth daily     rosuvastatin (CRESTOR) 5 MG tablet TAKE 1 TABLET BY MOUTH DAILY 100 tablet 2   Thiamine HCl (VITAMIN B-1 PO) Take by mouth daily.     Turmeric (QC TUMERIC COMPLEX PO) Take by mouth.     vitamin C (ASCORBIC ACID) 500 MG tablet Take 500 mg by mouth daily.     Fluorouracil (TOLAK) 4 % CREA Apply 1 application. topically at bedtime. Apply topically nightly for 2 weeks (Patient not taking:  Reported on 01/12/2023) 40 g 0   triamcinolone cream (KENALOG) 0.1 % Apply 1 application topically 2 (two) times daily. (Patient not taking: Reported on 01/12/2023) 45 g 0   Current Facility-Administered Medications  Medication Dose Route Frequency Provider Last Rate Last Admin   0.9 %  sodium chloride infusion  500 mL Intravenous Once Meryl Dare, MD        Allergies as of 01/25/2023 - Review Complete 01/25/2023  Allergen Reaction Noted   Aspirin Rash 11/29/2019    Family History  Problem Relation Age of Onset   Arthritis Mother        spinal stenosis s/p surgery   Diabetes Neg Hx    Heart disease Neg Hx    Colon cancer Neg Hx    Stomach cancer Neg Hx    Colon polyps Neg Hx    Rectal cancer Neg Hx     Social History   Socioeconomic History   Marital status: Married    Spouse name: Not on file   Number of children: 3   Years of education: 16   Highest education level: Not on file  Occupational History   Occupation: sales  Tobacco Use   Smoking status: Former    Current packs/day: 0.00    Types: Cigarettes    Quit date: 04/16/1967    Years since quitting: 55.8   Smokeless tobacco: Never  Vaping Use   Vaping status: Never Used  Substance and Sexual Activity   Alcohol use: No   Drug use: No   Sexual activity: Yes    Partners: Female  Other Topics Concern   Not on file  Social History Narrative   HSG, [place] - [place]. Married '75. 2 sons - '71(adopted), '76; 1 dtr (adopted) '73; 4 grandchildren.   Work - Secretary/administrator, currently Therapist, occupational. Marriage is in good health. ACP - HCPOA - wife and son; yes - CPR; yes for acute care; no prolonged futile or heroic measures.      JEHOVAH'S WITNESS - OK for cell salvage, volume expanded, immunoglobulins. Solid organ transplant - ????   Social Determinants of Health   Financial Resource Strain: Low Risk  (04/27/2022)   Overall Financial Resource Strain (CARDIA)    Difficulty of Paying Living Expenses: Not hard  at all  Food Insecurity: No Food Insecurity (04/27/2022)   Hunger Vital Sign    Worried About Running Out of Food in the Last Year: Never true    Ran Out of Food in the Last Year: Never true  Transportation Needs: No Transportation Needs (04/27/2022)   PRAPARE - Administrator, Civil Service (Medical): No    Lack of Transportation (Non-Medical): No  Physical Activity: Insufficiently Active (04/27/2022)   Exercise Vital Sign  Days of Exercise per Week: 3 days    Minutes of Exercise per Session: 30 min  Stress: No Stress Concern Present (04/27/2022)   [employer] of Occupational Health - Occupational Stress Questionnaire    Feeling of Stress : Not at all  Social Connections: Unknown (04/27/2022)   Social Connection and Isolation Panel [NHANES]    Frequency of Communication with Friends and Family: More than three times a week    Frequency of Social Gatherings with Friends and Family: More than three times a week    Attends Religious Services: Not on file    Active Member of Clubs or Organizations: Yes    Attends Banker Meetings: More than 4 times per year    Marital Status: Married  Catering manager Violence: Not At Risk (04/27/2022)   Humiliation, Afraid, Rape, and Kick questionnaire    Fear of Current or Ex-Partner: No    Emotionally Abused: No    Physically Abused: No    Sexually Abused: No    Review of Systems:  All systems reviewed were negative except where noted in HPI.   Physical Exam:  General:  Alert, well-developed, in NAD Head:  Normocephalic and atraumatic. Eyes:  Sclera clear, no icterus.   Conjunctiva pink. Ears:  Normal auditory acuity. Mouth:  No deformity or lesions.  Neck:  Supple; no masses. Lungs:  Clear throughout to auscultation.   No wheezes, crackles, or rhonchi.  Heart:  Regular rate and rhythm; no murmurs. Abdomen:  Soft, nondistended, nontender. No masses, hepatomegaly. No palpable masses.  Normal bowel sounds.     Rectal:  Deferred   Msk:  Symmetrical without gross deformities. Extremities:  Without edema. Neurologic:  Alert and  oriented x 4; grossly normal neurologically. Skin:  Intact without significant lesions or rashes. Psych:  Alert and cooperative. Normal mood and affect.   Venita Lick. Russella Dar  01/25/2023, 8:02 AM See Loretha Stapler, [facility], to contact our on call provider

## 2023-01-25 NOTE — Progress Notes (Signed)
Pt's states no medical or surgical changes since previsit or office visit. 

## 2023-01-26 ENCOUNTER — Telehealth: Payer: Self-pay

## 2023-01-26 NOTE — Telephone Encounter (Signed)
  Follow up Call-     01/25/2023    7:14 AM  Call back number  Post procedure Call Back phone  # [phone] (spouse's phone)  Permission to leave phone message Yes     Patient questions:  Do you have a fever, pain , or abdominal swelling? No. Pain Score  0 *  Have you tolerated food without any problems? Yes.    Have you been able to return to your normal activities? Yes.    Do you have any questions about your discharge instructions: Diet   No. Medications  No. Follow up visit  No.  Do you have questions or concerns about your Care? No.  Actions: * If pain score is 4 or above: No action needed, pain <4.

## 2023-02-15 DIAGNOSIS — Z08 Encounter for follow-up examination after completed treatment for malignant neoplasm: Secondary | ICD-10-CM | POA: Diagnosis not present

## 2023-02-15 DIAGNOSIS — L821 Other seborrheic keratosis: Secondary | ICD-10-CM | POA: Diagnosis not present

## 2023-02-15 DIAGNOSIS — Z85828 Personal history of other malignant neoplasm of skin: Secondary | ICD-10-CM | POA: Diagnosis not present

## 2023-02-15 DIAGNOSIS — L814 Other melanin hyperpigmentation: Secondary | ICD-10-CM | POA: Diagnosis not present

## 2023-02-15 DIAGNOSIS — D225 Melanocytic nevi of trunk: Secondary | ICD-10-CM | POA: Diagnosis not present

## 2023-03-25 ENCOUNTER — Ambulatory Visit: Payer: Medicare Other | Admitting: Family Medicine

## 2023-03-25 ENCOUNTER — Ambulatory Visit: Payer: Self-pay | Admitting: Internal Medicine

## 2023-03-25 ENCOUNTER — Encounter: Payer: Self-pay | Admitting: Family Medicine

## 2023-03-25 VITALS — BP 124/84 | HR 74 | Temp 98.1°F | Ht 72.0 in | Wt 216.8 lb

## 2023-03-25 DIAGNOSIS — K5901 Slow transit constipation: Secondary | ICD-10-CM

## 2023-03-25 DIAGNOSIS — K921 Melena: Secondary | ICD-10-CM

## 2023-03-25 DIAGNOSIS — K640 First degree hemorrhoids: Secondary | ICD-10-CM

## 2023-03-25 LAB — CBC
HCT: 35 % — ABNORMAL LOW (ref 39.0–52.0)
Hemoglobin: 12.4 g/dL — ABNORMAL LOW (ref 13.0–17.0)
MCHC: 35.4 g/dL (ref 30.0–36.0)
MCV: 94.4 fL (ref 78.0–100.0)
Platelets: 130 10*3/uL — ABNORMAL LOW (ref 150.0–400.0)
RBC: 3.71 Mil/uL — ABNORMAL LOW (ref 4.22–5.81)
RDW: 13.7 % (ref 11.5–15.5)
WBC: 4.2 10*3/uL (ref 4.0–10.5)

## 2023-03-25 NOTE — Telephone Encounter (Signed)
  Chief Complaint: rectal bleeding Symptoms: red blood in toilet water when having bowel movement Frequency: 3 episodes since Tuesday Pertinent Negatives: Patient denies fever, abdominal pain, rectal pain, nausea, vomiting Disposition: [] ED /[] Urgent Care (no appt availability in office) / [x] Appointment(In office/virtual)/ []  [facility]/ [] Home Care/ [] Refused Recommended Disposition /[]  Mobile Bus/ []  Follow-up with PCP Additional Notes: Patient and wife on phone for triage. Patient states he had been constipated and drank some diet tea that cleared me out and states after he noticed a large external hemorrhoid. He states on Tuesday he had 2 episodes of blood in the toilet after having bowel movements. He states the second episode was light. He states after that the hemorrhoid seemed to shrink. He states no blood on Wednesday. He states this morning he noticed moderate amount of blood and some small dark strings, possibly blood clots. Wife states patient is up and walking around with no distress.  Copied from CRM [id number]. Topic: Clinical - Red Word Triage >> Mar 25, 2023  7:51 AM Montie POUR wrote: Red Word that prompted transfer to Nurse Triage: Blood in stools and it has been going on for several days. No fever Reason for Disposition  MODERATE rectal bleeding (small blood clots, passing blood without stool, or toilet water turns red)  Answer Assessment - Initial Assessment Questions 1. APPEARANCE of BLOOD: What color is it? Is it passed separately, on the surface of the stool, or mixed in with the stool?      Bright red; in the toilet water. Unsure if there is any blood in the stool.  2. AMOUNT: How much blood was passed?      The first time was a lot, the second time was light, and then went again today was a lot and looked like dark strands or clots patient unable to state a volume amount  3. FREQUENCY: How many times has blood been passed with the stools?       3 times over the past 3 days.  4. ONSET: When was the blood first seen in the stools? (Days or weeks)      Tuesday.  5. DIARRHEA: Is there also some diarrhea? If Yes, ask: How many diarrhea stools in the past 24 hours?      Denies.  6. CONSTIPATION: Do you have constipation? If Yes, ask: How bad is it?     Yes; recent constipation and states he drank  diet tea and it helped but then he states he noticed a large hemorrhoid.  7. RECURRENT SYMPTOMS: Have you had blood in your stools before? If Yes, ask: When was the last time? and What happened that time?      When I've had hemorrhoids in the past, a couple of times over the years when it would burst I'd have it over a couple of days  8. BLOOD THINNERS: Do you take any blood thinners? (e.g., Coumadin/warfarin, Pradaxa/dabigatran, aspirin )     Aspirin .  9. OTHER SYMPTOMS: Do you have any other symptoms?  (e.g., abdomen pain, vomiting, dizziness, fever)     Denies.  Protocols used: Rectal Bleeding-A-AH

## 2023-03-25 NOTE — Progress Notes (Signed)
 Established Patient Office Visit   Subjective:  Patient ID: Richard [name], male    DOB: [birth year]  Age: 73 y.o. MRN: [id number]  Chief Complaint  Patient presents with   Rectal Bleeding    Pt complains of bloody stool with bowel movements. Hx of hemorrhoids.     Rectal Bleeding  Pertinent negatives include no abdominal pain, no nausea, no vomiting and no rash.   Encounter Diagnoses  Name Primary?   Hematochezia Yes   Grade I hemorrhoids    Slow transit constipation    For follow-up of a hematochezia noted over the last 3 days.  These were preceded by a 3-day history of constipation.  He used a preparation called: Dieter's Tea for relief of his constipation before the hematochezia started.  There has been no abdominal pain, fevers chills, lightheadedness.  History of chronic constipation.  Recent colonoscopy in December of this past year for follow-up of an adenomatous polyp did show grade 1 external and internal hemorrhoids.  1 area of ectasia noted in the cecum.   Review of Systems  Constitutional: Negative.   HENT: Negative.    Eyes:  Negative for blurred vision, discharge and redness.  Respiratory: Negative.    Cardiovascular: Negative.   Gastrointestinal:  Positive for blood in stool, constipation and hematochezia. Negative for abdominal pain, melena, nausea and vomiting.  Genitourinary: Negative.   Musculoskeletal: Negative.  Negative for myalgias.  Skin:  Negative for rash.  Neurological:  Negative for tingling, loss of consciousness and weakness.  Endo/Heme/Allergies:  Negative for polydipsia.     Current Outpatient Medications:    aspirin  EC 81 MG tablet, Take 81 mg by mouth daily. Swallow whole., Disp: , Rfl:    b complex vitamins capsule, Take 1 capsule by mouth daily., Disp: , Rfl:    Cholecalciferol (VITAMIN D) 2000 UNITS CAPS, Take by mouth daily., Disp: , Rfl:    doxazosin  (CARDURA ) 4 MG tablet, TAKE 1 TABLET BY MOUTH DAILY  GENERIC EQUIVALENT FOR  CARDURA , Disp: 100 tablet, Rfl: 2   Flaxseed, Linseed, (FLAXSEED OIL PO), Take by mouth. Takes 1400 mg daily, Disp: , Rfl:    Fluorouracil  (TOLAK ) 4 % CREA, Apply 1 application. topically at bedtime. Apply topically nightly for 2 weeks, Disp: 40 g, Rfl: 0   Multiple Vitamin (MULTIVITAMIN) tablet, Take 1 tablet by mouth daily., Disp: , Rfl:    OVER THE COUNTER MEDICATION, [place] (OTC) take 1 by mouth daily, Disp: , Rfl:    rosuvastatin  (CRESTOR ) 5 MG tablet, TAKE 1 TABLET BY MOUTH DAILY, Disp: 100 tablet, Rfl: 2   Thiamine HCl (VITAMIN B-1 PO), Take by mouth daily., Disp: , Rfl:    triamcinolone  cream (KENALOG ) 0.1 %, Apply 1 application topically 2 (two) times daily., Disp: 45 g, Rfl: 0   Turmeric (QC TUMERIC COMPLEX PO), Take by mouth., Disp: , Rfl:    vitamin C (ASCORBIC ACID) 500 MG tablet, Take 500 mg by mouth daily., Disp: , Rfl:    methylPREDNISolone  (MEDROL  DOSEPAK) 4 MG TBPK tablet, As directed (Patient not taking: Reported on 03/25/2023), Disp: 21 tablet, Rfl: 0   Objective:     BP 124/84   Pulse 74   Temp 98.1 F (36.7 C)   Ht 6' (1.829 m)   Wt 216 lb 12.8 oz (98.3 kg)   SpO2 99%   BMI 29.40 kg/m    Physical Exam Constitutional:      General: He is not in acute distress.    Appearance:  Normal appearance. He is not ill-appearing, toxic-appearing or diaphoretic.  HENT:     Head: Normocephalic and atraumatic.     Right Ear: External ear normal.     Left Ear: External ear normal.  Eyes:     General: No scleral icterus.       Right eye: No discharge.        Left eye: No discharge.     Extraocular Movements: Extraocular movements intact.     Conjunctiva/sclera: Conjunctivae normal.  Cardiovascular:     Rate and Rhythm: Normal rate and regular rhythm.  Pulmonary:     Effort: Pulmonary effort is normal. No respiratory distress.     Breath sounds: No wheezing or rales.  Abdominal:     General: Bowel sounds are normal. There is no distension.     Tenderness: There is  no abdominal tenderness. There is no guarding or rebound.  Genitourinary:    Prostate: Enlarged. Not tender and no nodules present.     Rectum: Guaiac result negative. External hemorrhoid present. No mass, tenderness, anal fissure or internal hemorrhoid. Normal anal tone.     Comments: External hemorrhoids are without thrombosis or evidence of recent bleeding.  There was no blood on the exam glove.  Hemoccult was negative. Skin:    General: Skin is warm and dry.  Neurological:     Mental Status: He is alert and oriented to person, place, and time.  Psychiatric:        Mood and Affect: Mood normal.        Behavior: Behavior normal.      No results found for any visits on 03/25/23.    The ASCVD Risk score (Arnett DK, et al., 2019) failed to calculate for the following reasons:   The valid total cholesterol range is 130 to 320 mg/dL    Assessment & Plan:   Hematochezia -     CBC  Grade I hemorrhoids  Slow transit constipation    Return Schedule follow-up with primary.  Will check CBC today to confirm stability.  Advised using Colace/Metamucil/MiraLAX as needed.  Advised increased exercise with increased fiber in the diet.  Advised to avoid prolonged toilet bowl sitting.   Elsie Sim Lent, MD

## 2023-03-29 ENCOUNTER — Inpatient Hospital Stay (HOSPITAL_BASED_OUTPATIENT_CLINIC_OR_DEPARTMENT_OTHER): Payer: Medicare Other | Admitting: Hematology & Oncology

## 2023-03-29 ENCOUNTER — Inpatient Hospital Stay: Payer: Medicare Other | Attending: Hematology & Oncology

## 2023-03-29 ENCOUNTER — Encounter: Payer: Self-pay | Admitting: Hematology & Oncology

## 2023-03-29 VITALS — BP 107/62 | HR 64 | Temp 97.6°F | Resp 20 | Ht 72.0 in | Wt 213.1 lb

## 2023-03-29 DIAGNOSIS — Z7982 Long term (current) use of aspirin: Secondary | ICD-10-CM | POA: Insufficient documentation

## 2023-03-29 DIAGNOSIS — Z8719 Personal history of other diseases of the digestive system: Secondary | ICD-10-CM | POA: Insufficient documentation

## 2023-03-29 DIAGNOSIS — D61818 Other pancytopenia: Secondary | ICD-10-CM | POA: Diagnosis not present

## 2023-03-29 DIAGNOSIS — Z79899 Other long term (current) drug therapy: Secondary | ICD-10-CM | POA: Insufficient documentation

## 2023-03-29 DIAGNOSIS — D696 Thrombocytopenia, unspecified: Secondary | ICD-10-CM

## 2023-03-29 DIAGNOSIS — K5900 Constipation, unspecified: Secondary | ICD-10-CM | POA: Diagnosis not present

## 2023-03-29 LAB — CMP (CANCER CENTER ONLY)
ALT: 17 U/L (ref 0–44)
AST: 16 U/L (ref 15–41)
Albumin: 4.4 g/dL (ref 3.5–5.0)
Alkaline Phosphatase: 58 U/L (ref 38–126)
Anion gap: 6 (ref 5–15)
BUN: 25 mg/dL — ABNORMAL HIGH (ref 8–23)
CO2: 28 mmol/L (ref 22–32)
Calcium: 9.7 mg/dL (ref 8.9–10.3)
Chloride: 107 mmol/L (ref 98–111)
Creatinine: 1.3 mg/dL — ABNORMAL HIGH (ref 0.61–1.24)
GFR, Estimated: 58 mL/min — ABNORMAL LOW (ref >60)
Glucose, Bld: 102 mg/dL — ABNORMAL HIGH (ref 70–99)
Potassium: 5.1 mmol/L (ref 3.5–5.1)
Sodium: 141 mmol/L (ref 135–145)
Total Bilirubin: 0.8 mg/dL (ref 0.0–1.2)
Total Protein: 7.1 g/dL (ref 6.5–8.1)

## 2023-03-29 LAB — CBC WITH DIFFERENTIAL (CANCER CENTER ONLY)
Abs Immature Granulocytes: 0 10*3/uL (ref 0.00–0.07)
Basophils Absolute: 0 10*3/uL (ref 0.0–0.1)
Basophils Relative: 0 %
Eosinophils Absolute: 0.1 10*3/uL (ref 0.0–0.5)
Eosinophils Relative: 2 %
HCT: 34.7 % — ABNORMAL LOW (ref 39.0–52.0)
Hemoglobin: 12.7 g/dL — ABNORMAL LOW (ref 13.0–17.0)
Immature Granulocytes: 0 %
Lymphocytes Relative: 28 %
Lymphs Abs: 1.2 10*3/uL (ref 0.7–4.0)
MCH: 33.5 pg (ref 26.0–34.0)
MCHC: 36.6 g/dL — ABNORMAL HIGH (ref 30.0–36.0)
MCV: 91.6 fL (ref 80.0–100.0)
Monocytes Absolute: 0.5 10*3/uL (ref 0.1–1.0)
Monocytes Relative: 12 %
Neutro Abs: 2.4 10*3/uL (ref 1.7–7.7)
Neutrophils Relative %: 58 %
Platelet Count: 122 10*3/uL — ABNORMAL LOW (ref 150–400)
RBC: 3.79 MIL/uL — ABNORMAL LOW (ref 4.22–5.81)
RDW: 12.4 % (ref 11.5–15.5)
WBC Count: 4.2 10*3/uL (ref 4.0–10.5)
nRBC: 0 % (ref 0.0–0.2)

## 2023-03-29 LAB — SAVE SMEAR(SSMR), FOR PROVIDER SLIDE REVIEW

## 2023-03-29 LAB — LACTATE DEHYDROGENASE: LDH: 138 U/L (ref 98–192)

## 2023-03-29 NOTE — Progress Notes (Signed)
 Hematology and Oncology Follow Up Visit  Richard [name] [id number] [birth year] 73 y.o. 03/29/2023   Principle Diagnosis:  Pancytopenia -- mild  Current Therapy:   Observation     Interim History:  Richard [name] is back for follow-up.  It has been a year since we last saw him.  He comes in with his wife.  As always, they are very fun to talk to.  They are very interesting.  I really enjoyed their perspectives.  He is having some constipation problems.  He did have a colonoscopy in December.  Everything looks fine and as such, he does not need another colonoscopy from what he was told.  He has had no problem with fever.  He has had no issues with infections.  He has had no problems with COVID.  His appetite is good.  He has had no nausea or vomiting.  He has had no bleeding.  He did have some bleeding from internal hemorrhoids but this is stopped.  He has had no rashes.  Overall, I would say that his performance status is probably ECOG 1.      Medications:  Current Outpatient Medications:    aspirin  EC 81 MG tablet, Take 81 mg by mouth daily. Swallow whole., Disp: , Rfl:    b complex vitamins capsule, Take 1 capsule by mouth daily., Disp: , Rfl:    Cholecalciferol (VITAMIN D) 2000 UNITS CAPS, Take by mouth daily., Disp: , Rfl:    doxazosin  (CARDURA ) 4 MG tablet, TAKE 1 TABLET BY MOUTH DAILY  GENERIC EQUIVALENT FOR CARDURA , Disp: 100 tablet, Rfl: 2   Flaxseed, Linseed, (FLAXSEED OIL PO), Take by mouth. Takes 1400 mg daily, Disp: , Rfl:    Multiple Vitamin (MULTIVITAMIN) tablet, Take 1 tablet by mouth daily., Disp: , Rfl:    rosuvastatin  (CRESTOR ) 5 MG tablet, TAKE 1 TABLET BY MOUTH DAILY, Disp: 100 tablet, Rfl: 2   Thiamine HCl (VITAMIN B-1 PO), Take by mouth daily., Disp: , Rfl:    Turmeric (QC TUMERIC COMPLEX PO), Take by mouth daily., Disp: , Rfl:    vitamin C (ASCORBIC ACID) 500 MG tablet, Take 500 mg by mouth daily., Disp: , Rfl:    Fluorouracil  (TOLAK ) 4 % CREA, Apply 1  application. topically at bedtime. Apply topically nightly for 2 weeks (Patient not taking: Reported on 03/29/2023), Disp: 40 g, Rfl: 0   triamcinolone  cream (KENALOG ) 0.1 %, Apply 1 application topically 2 (two) times daily. (Patient not taking: Reported on 03/29/2023), Disp: 45 g, Rfl: 0  Allergies:  No Active Allergies   Past Medical History, Surgical history, Social history, and Family History were reviewed and updated.  Review of Systems: Review of Systems  Constitutional: Negative.   HENT:  Negative.    Eyes: Negative.   Respiratory: Negative.    Cardiovascular: Negative.   Gastrointestinal: Negative.   Endocrine: Negative.   Genitourinary: Negative.    Musculoskeletal: Negative.   Skin: Negative.   Neurological: Negative.   Hematological: Negative.   Psychiatric/Behavioral: Negative.      Physical Exam:  height is 6' (1.829 m) and weight is 213 lb 1.9 oz (96.7 kg). His oral temperature is 97.6 F (36.4 C). His blood pressure is 107/62 and his pulse is 64. His respiration is 20 and oxygen saturation is 98%.   Wt Readings from Last 3 Encounters:  03/29/23 213 lb 1.9 oz (96.7 kg)  03/25/23 216 lb 12.8 oz (98.3 kg)  01/25/23 210 lb (95.3 kg)    Physical Exam  Vitals reviewed.  HENT:     Head: Normocephalic and atraumatic.  Eyes:     Pupils: Pupils are equal, round, and reactive to light.  Cardiovascular:     Rate and Rhythm: Normal rate and regular rhythm.     Heart sounds: Normal heart sounds.  Pulmonary:     Effort: Pulmonary effort is normal.     Breath sounds: Normal breath sounds.  Abdominal:     General: Bowel sounds are normal.     Palpations: Abdomen is soft.  Musculoskeletal:        General: No tenderness or deformity. Normal range of motion.     Cervical back: Normal range of motion.  Lymphadenopathy:     Cervical: No cervical adenopathy.  Skin:    General: Skin is warm and dry.     Findings: No erythema or rash.  Neurological:     Mental Status:  He is alert and oriented to person, place, and time.  Psychiatric:        Behavior: Behavior normal.        Thought Content: Thought content normal.        Judgment: Judgment normal.      Lab Results  Component Value Date   WBC 4.2 03/29/2023   HGB 12.7 (L) 03/29/2023   HCT 34.7 (L) 03/29/2023   MCV 91.6 03/29/2023   PLT 122 (L) 03/29/2023     Chemistry      Component Value Date/Time   NA 136 03/30/2022 0750   K 4.1 03/30/2022 0750   CL 107 03/30/2022 0750   CO2 25 03/30/2022 0750   BUN 25 (H) 03/30/2022 0750   CREATININE 1.25 (H) 03/30/2022 0750   CREATININE 1.13 10/30/2019 0850      Component Value Date/Time   CALCIUM  8.7 (L) 03/30/2022 0750   ALKPHOS 62 03/30/2022 0750   AST 19 03/30/2022 0750   ALT 21 03/30/2022 0750   BILITOT 0.8 03/30/2022 0750      Impression and Plan: Richard [name] is a very nice 73 year old white male.  He has mild pancytopenia.  So far, everything has been holding quite stable.  Going back to even 3 years ago, everything has been holding steady.   I looked at his blood smear.  I do not see any immature myeloid or lymphoid forms.  There are no nucleated red blood cells.  I saw no schistocytes or spherocytes.  Platelets were well granulated.  Clearly, yearly follow-up would be reasonable.  He can always come back sooner if he has any problems.  Again he and his wife are both very active.  They really are quite diligent with taking care of their health.  I must give him a lot of credit for that.     Ivor Mars, MD 2/10/20258:09 AM

## 2023-04-20 ENCOUNTER — Ambulatory Visit: Payer: Medicare Other

## 2023-04-20 VITALS — Ht 72.0 in | Wt 213.0 lb

## 2023-04-20 DIAGNOSIS — Z0000 Encounter for general adult medical examination without abnormal findings: Secondary | ICD-10-CM | POA: Diagnosis not present

## 2023-04-20 NOTE — Patient Instructions (Addendum)
 Richard [name] , Thank you for taking time to come for your Medicare Wellness Visit. I appreciate your ongoing commitment to your health goals. Please review the following plan we discussed and let me know if I can assist you in the future.   Referrals/Orders/Follow-Ups/Clinician Recommendations: It was nice talking to you today.  You are due for a Tetanus vaccine.  Keep up the good work.    This is a list of the screening recommended for you and due dates:  Health Maintenance  Topic Date Due   Zoster (Shingles) Vaccine (1 of 2) 07/[birth year]/1968   DTaP/Tdap/Td vaccine (2 - Td or Tdap) 04/15/2021   COVID-19 Vaccine (6 - 2024-25 season) 10/18/2022   Medicare Annual Wellness Visit  04/19/2024   Colon Cancer Screening  01/25/2028   Pneumonia Vaccine  Completed   Flu Shot  Completed   Hepatitis C Screening  Completed   HPV Vaccine  Aged Out    Advanced directives: (Copy Requested) Please bring a copy of your health care power of attorney and living will to the office to be added to your chart at your convenience.  Next Medicare Annual Wellness Visit scheduled for next year: Yes

## 2023-04-20 NOTE — Progress Notes (Addendum)
 Subjective:   Richard [name] is a 73 y.o. who presents for a Medicare Wellness preventive visit.  Visit Complete: Virtual I connected with  Richard [name] on 04/20/23 by a video and audio enabled telemedicine application and verified that I am speaking with the correct person using two identifiers.  Patient Location: Home  Provider Location: Home Office  I discussed the limitations of evaluation and management by telemedicine. The patient expressed understanding and agreed to proceed.  Vital Signs: Because this visit was a virtual/telehealth visit, some criteria may be missing or patient reported. Any vitals not documented were not able to be obtained and vitals that have been documented are patient reported.    AWV Questionnaire: No: Patient Medicare AWV questionnaire was not completed prior to this visit.  Cardiac Risk Factors include: advanced age (>61men, >88 women);male gender;Other (see comment), Risk factor comments: Coronary atherosclerosis, BPH     Objective:    Today's Vitals   04/20/23 1448  Weight: 213 lb (96.6 kg)  Height: 6' (1.829 m)   Body mass index is 28.89 kg/m.     04/20/2023    3:44 PM 03/29/2023    8:00 AM 03/29/2023    7:59 AM 04/27/2022    9:52 AM 03/30/2022    8:01 AM 04/22/2021    2:26 PM 04/03/2021    9:14 AM  Advanced Directives  Does Patient Have a Medical Advance Directive? Yes Yes No Yes No Yes No  Type of Estate agent of [place];Living will Healthcare Power of [place];Living will  Healthcare Power of [place];Living will  Living will;Healthcare Power of Attorney   Does patient want to make changes to medical advance directive?      No - Patient declined   Copy of Healthcare Power of Attorney in Chart? No - copy requested   No - copy requested  No - copy requested   Would patient like information on creating a medical advance directive?   No - Patient declined  No - Patient declined No - Patient declined No - Patient  declined    Current Medications (verified) Outpatient Encounter Medications as of 04/20/2023  Medication Sig   aspirin EC 81 MG tablet Take 81 mg by mouth daily. Swallow whole.   b complex vitamins capsule Take 1 capsule by mouth daily.   Cholecalciferol (VITAMIN D) 2000 UNITS CAPS Take by mouth daily.   doxazosin (CARDURA) 4 MG tablet TAKE 1 TABLET BY MOUTH DAILY  GENERIC EQUIVALENT FOR CARDURA   Flaxseed, Linseed, (FLAXSEED OIL PO) Take by mouth. Takes 1400 mg daily   Fluorouracil (TOLAK) 4 % CREA Apply 1 application. topically at bedtime. Apply topically nightly for 2 weeks (Patient not taking: Reported on 03/29/2023)   Multiple Vitamin (MULTIVITAMIN) tablet Take 1 tablet by mouth daily.   rosuvastatin (CRESTOR) 5 MG tablet TAKE 1 TABLET BY MOUTH DAILY   Thiamine HCl (VITAMIN B-1 PO) Take by mouth daily.   triamcinolone cream (KENALOG) 0.1 % Apply 1 application topically 2 (two) times daily. (Patient not taking: Reported on 03/29/2023)   Turmeric (QC TUMERIC COMPLEX PO) Take by mouth daily.   vitamin C (ASCORBIC ACID) 500 MG tablet Take 500 mg by mouth daily.   No facility-administered encounter medications on file as of 04/20/2023.    Allergies (verified) Patient has no active allergies.   History: Past Medical History:  Diagnosis Date   Basal cell carcinoma 10/24/2020   left ant neck bcc mohs   BPH (benign prostatic hyperplasia)  Hypertension    Hypogonadism male    Mononucleosis    Mumps    Plantar fasciitis    PONV (postoperative nausea and vomiting)    Squamous cell carcinoma of skin 10/24/2020   mod forehead and right temple   Zoster    Past Surgical History:  Procedure Laterality Date   circumsicion  2007   asa adult for balanitis w/ phimosis   COLONOSCOPY  2013   HERNIA REPAIR Left    INGUINAL HERNIA REPAIR  childhood   left   LIPOMA EXCISION N/A 02/19/2016   Procedure: EXCISION SUBCUTANEOUS LIPOMA BACK;  Surgeon: Manus Rudd, MD;  Location: [facility];  Service: General;  Laterality: N/A;   MOUTH SURGERY     early eruption of incisors requiring surgical extraction   POLYPECTOMY     TONSILLECTOMY  1959   WISDOM TOOTH EXTRACTION  1990   Family History  Problem Relation Age of Onset   Arthritis Mother        spinal stenosis s/p surgery   Diabetes Neg Hx    Heart disease Neg Hx    Colon cancer Neg Hx    Stomach cancer Neg Hx    Colon polyps Neg Hx    Rectal cancer Neg Hx    Social History   Socioeconomic History   Marital status: Married    Spouse name: Byrd Hesselbach   Number of children: 3   Years of education: 16   Highest education level: Bachelor's degree (e.g., BA, AB, BS)  Occupational History   Occupation: Airline pilot   Occupation: Retired  Tobacco Use   Smoking status: Former    Current packs/day: 0.00    Types: Cigarettes    Quit date: 04/16/1967    Years since quitting: 56.0   Smokeless tobacco: Never  Vaping Use   Vaping status: Never Used  Substance and Sexual Activity   Alcohol use: No   Drug use: No   Sexual activity: Yes    Partners: Female  Other Topics Concern   Not on file  Social History Narrative   HSG, [place] - [place]. Married '75. 2 sons - '71(adopted), '76; 1 dtr (adopted) '73; 4 grandchildren.Work - Secretary/administrator, currently Therapist, occupational. Marriage is in good health. ACP - HCPOA - wife and son; yes - CPR; yes for acute care; no prolonged futile or heroic measures.JEHOVAH'S WITNESS - OK for cell salvage, volume expanded, immunoglobulins. Solid organ transplant - ????      Lives at home with his husband. 2025   Social Drivers of Corporate investment banker Strain: Low Risk  (04/20/2023)   Overall Financial Resource Strain (CARDIA)    Difficulty of Paying Living Expenses: Not very hard  Food Insecurity: No Food Insecurity (04/20/2023)   Hunger Vital Sign    Worried About Running Out of Food in the Last Year: Never true    Ran Out of Food in the Last Year: Never true  Transportation Needs: No  Transportation Needs (04/20/2023)   PRAPARE - Administrator, Civil Service (Medical): No    Lack of Transportation (Non-Medical): No  Physical Activity: Insufficiently Active (04/20/2023)   Exercise Vital Sign    Days of Exercise per Week: 3 days    Minutes of Exercise per Session: 30 min  Stress: No Stress Concern Present (04/20/2023)   [employer] of Occupational Health - Occupational Stress Questionnaire    Feeling of Stress : Not at all  Social Connections: Socially Integrated (04/20/2023)  Social Advertising account executive [NHANES]    Frequency of Communication with Friends and Family: More than three times a week    Frequency of Social Gatherings with Friends and Family: Twice a week    Attends Religious Services: More than 4 times per year    Active Member of [employer] or Organizations: Yes    Attends Banker Meetings: Never    Marital Status: Married    Tobacco Counseling Counseling given: Not Answered    Clinical Intake:  Pre-visit preparation completed: Yes  Pain : No/denies pain     BMI - recorded: 28.89 Nutritional Status: BMI 25 -29 Overweight Nutritional Risks: None Diabetes: No  How often do you need to have someone help you when you read instructions, pamphlets, or other written materials from your doctor or pharmacy?: 1 - Never  Interpreter Needed?: No  Information entered by :: Haliyah Fryman, RMA   Activities of Daily Living     04/20/2023    2:48 PM 04/27/2022   10:08 AM  In your present state of health, do you have any difficulty performing the following activities:  Hearing? 0 0  Vision? 0 0  Difficulty concentrating or making decisions? 0 0  Walking or climbing stairs? 0 0  Dressing or bathing? 0 0  Doing errands, shopping? 0 0  Preparing Food and eating ? N N  Using the Toilet? N N  In the past six months, have you accidently leaked urine? N N  Do you have problems with loss of bowel control? N N  Managing your  Medications? N N  Managing your Finances? N N  Housekeeping or managing your Housekeeping? N N    Patient Care Team: Plotnikov, Georgina Quint, MD as PCP - General (Internal Medicine) Glyn Ade, PA-C as Physician Assistant (Dermatology) Janet Berlin, MD as Consulting Physician (Ophthalmology)  Indicate any recent Medical Services you may have received from other than [facility] providers in the past year (date may be approximate).     Assessment:   This is a routine wellness examination for [place].  Hearing/Vision screen Hearing Screening - Comments:: Denies hearing difficulties   Vision Screening - Comments:: Wears eyeglasses   Goals Addressed               This Visit's Progress     Patient Stated (pt-stated)        Losing a couple of lbs, exercise more       Depression Screen     04/20/2023    3:49 PM 10/28/2022    9:38 AM 04/27/2022   10:09 AM 10/27/2021    9:12 AM 06/25/2021   10:38 AM 04/22/2021    2:32 PM 04/19/2020    8:39 AM  PHQ 2/9 Scores  PHQ - 2 Score 0 0 0   0 0 0 0 0  PHQ- 9 Score 0  0   0 0       Fall Risk     04/20/2023    3:44 PM 10/28/2022    9:38 AM 04/27/2022   10:09 AM 04/23/2022    9:25 AM 10/27/2021    9:12 AM  Fall Risk   Falls in the past year? 0 0 0   0 0 0  Number falls in past yr: 0 0 0   0  0  Injury with Fall? 0 0 0   0  0  Risk for fall due to : No Fall Risks No Fall Risks No Fall Risks  No Fall Risks  No Fall Risks  Follow up Falls prevention discussed;Falls evaluation completed Falls evaluation completed Falls evaluation completed   Falls evaluation completed      MEDICARE RISK AT HOME:  Medicare Risk at Home Any stairs in or around the home?: No Home free of loose throw rugs in walkways, pet beds, electrical cords, etc?: Yes Adequate lighting in your home to reduce risk of falls?: Yes Life alert?: No Use of a cane, walker or w/c?: No Grab bars in the bathroom?: Yes Shower chair or bench in shower?: Yes Elevated toilet  seat or a handicapped toilet?: Yes  TIMED UP AND GO:  Was the test performed?  No  Cognitive Function: 6CIT completed        04/20/2023    3:45 PM 04/27/2022   10:07 AM  6CIT Screen  What Year? 0 points 0 points  What month? 0 points 0 points  What time? 0 points 0 points  Count back from 20 0 points 0 points  Months in reverse 0 points 0 points  Repeat phrase 0 points 0 points  Total Score 0 points 0 points    Immunizations Immunization History  Administered Date(s) Administered   Fluad Quad(high Dose 65+) 10/21/2018, 10/21/2018, 10/18/2019, 10/27/2021   Fluad Trivalent(High Dose 65+) 10/28/2022   Influenza Whole 12/16/2007, 11/13/2008, 01/18/2012   Influenza, High Dose Seasonal PF 10/18/2015, 10/25/2017   Influenza,inj,Quad PF,6+ Mos 11/14/2014, 10/23/2016   PFIZER(Purple Top)SARS-COV-2 Vaccination 03/09/2019, 03/30/2019, 11/21/2019, 05/24/2020   PNEUMOCOCCAL CONJUGATE-20 08/18/2021   Pfizer(Comirnaty)Fall Seasonal Vaccine 12 years and older 11/10/2021   Pneumococcal Conjugate-13 08/06/2015   Pneumococcal Polysaccharide-23 04/16/2011   Tdap 04/16/2011   Zoster, Live 09/26/2011    Screening Tests Health Maintenance  Topic Date Due   Zoster Vaccines- Shingrix (1 of 2) 07/29/1968   DTaP/Tdap/Td (2 - Td or Tdap) 04/15/2021   COVID-19 Vaccine (6 - 2024-25 season) 10/18/2022   Medicare Annual Wellness (AWV)  04/19/2024   Colonoscopy  01/25/2028   Pneumonia Vaccine 20+ Years old  Completed   INFLUENZA VACCINE  Completed   Hepatitis C Screening  Completed   HPV VACCINES  Aged Out    Health Maintenance  Health Maintenance Due  Topic Date Due   Zoster Vaccines- Shingrix (1 of 2) 07/29/1968   DTaP/Tdap/Td (2 - Td or Tdap) 04/15/2021   COVID-19 Vaccine (6 - 2024-25 season) 10/18/2022   Health Maintenance Items Addressed: See Nurse Notes  Additional Screening:  Vision Screening: Recommended annual ophthalmology exams for early detection of glaucoma and other  disorders of the eye.  Dental Screening: Recommended annual dental exams for proper oral hygiene  Community Resource Referral / Chronic Care Management: CRR required this visit?  No   CCM required this visit?  No     Plan:     I have personally reviewed and noted the following in the patient's chart:   Medical and social history Use of alcohol, tobacco or illicit drugs  Current medications and supplements including opioid prescriptions. Patient is not currently taking opioid prescriptions. Functional ability and status Nutritional status Physical activity Advanced directives List of other physicians Hospitalizations, surgeries, and ER visits in previous 12 months Vitals Screenings to include cognitive, depression, and falls Referrals and appointments  In addition, I have reviewed and discussed with patient certain preventive protocols, quality metrics, and best practice recommendations. A written personalized care plan for preventive services as well as general preventive health recommendations were provided to patient.     Micha Dosanjh L  Saysha Menta, CMA   04/20/2023   After Visit Summary: (MyChart) Due to this being a telephonic visit, the after visit summary with patients personalized plan was offered to patient via MyChart   Notes: Please refer to Routing Comments.  Medical screening examination/treatment/procedure(s) were performed by non-physician practitioner and as supervising physician I was immediately available for consultation/collaboration.  I agree with above. Jacinta Shoe, MD

## 2023-04-26 ENCOUNTER — Encounter: Payer: Self-pay | Admitting: Internal Medicine

## 2023-04-26 ENCOUNTER — Ambulatory Visit (INDEPENDENT_AMBULATORY_CARE_PROVIDER_SITE_OTHER): Payer: Medicare Other | Admitting: Internal Medicine

## 2023-04-26 VITALS — BP 110/60 | HR 67 | Temp 97.7°F | Ht 72.0 in | Wt 212.0 lb

## 2023-04-26 DIAGNOSIS — M545 Low back pain, unspecified: Secondary | ICD-10-CM

## 2023-04-26 DIAGNOSIS — I10 Essential (primary) hypertension: Secondary | ICD-10-CM | POA: Insufficient documentation

## 2023-04-26 DIAGNOSIS — G8929 Other chronic pain: Secondary | ICD-10-CM

## 2023-04-26 DIAGNOSIS — R252 Cramp and spasm: Secondary | ICD-10-CM | POA: Diagnosis not present

## 2023-04-26 DIAGNOSIS — E785 Hyperlipidemia, unspecified: Secondary | ICD-10-CM

## 2023-04-26 DIAGNOSIS — D696 Thrombocytopenia, unspecified: Secondary | ICD-10-CM

## 2023-04-26 DIAGNOSIS — I2583 Coronary atherosclerosis due to lipid rich plaque: Secondary | ICD-10-CM | POA: Diagnosis not present

## 2023-04-26 NOTE — Assessment & Plan Note (Signed)
 Sporadic at night

## 2023-04-26 NOTE — Assessment & Plan Note (Signed)
F/u w/Dr Marin Olp

## 2023-04-26 NOTE — Progress Notes (Signed)
 Subjective:  Patient ID: Richard [name], male    DOB: 13-Apr-1950  Age: 73 y.o. MRN: [id number]  CC: Medical Management of Chronic Issues ( 6 MNTH F/U)   HPI Child Campoy Hinners presents for LBP, dyslipidemia, abn CBC  Outpatient Medications Prior to Visit  Medication Sig Dispense Refill   aspirin EC 81 MG tablet Take 81 mg by mouth daily. Swallow whole.     b complex vitamins capsule Take 1 capsule by mouth daily.     Cholecalciferol (VITAMIN D) 2000 UNITS CAPS Take by mouth daily.     doxazosin (CARDURA) 4 MG tablet TAKE 1 TABLET BY MOUTH DAILY  GENERIC EQUIVALENT FOR CARDURA 100 tablet 2   Flaxseed, Linseed, (FLAXSEED OIL PO) Take by mouth. Takes 1400 mg daily     Multiple Vitamin (MULTIVITAMIN) tablet Take 1 tablet by mouth daily.     rosuvastatin (CRESTOR) 5 MG tablet TAKE 1 TABLET BY MOUTH DAILY 100 tablet 2   Thiamine HCl (VITAMIN B-1 PO) Take by mouth daily.     Turmeric (QC TUMERIC COMPLEX PO) Take by mouth daily.     vitamin C (ASCORBIC ACID) 500 MG tablet Take 500 mg by mouth daily.     Fluorouracil (TOLAK) 4 % CREA Apply 1 application. topically at bedtime. Apply topically nightly for 2 weeks (Patient not taking: Reported on 04/26/2023) 40 g 0   triamcinolone cream (KENALOG) 0.1 % Apply 1 application topically 2 (two) times daily. (Patient not taking: Reported on 04/26/2023) 45 g 0   No facility-administered medications prior to visit.    ROS: Review of Systems  Constitutional:  Negative for appetite change, fatigue and unexpected weight change.  HENT:  Negative for congestion, nosebleeds, sneezing, sore throat and trouble swallowing.   Eyes:  Negative for itching and visual disturbance.  Respiratory:  Negative for cough.   Cardiovascular:  Negative for chest pain, palpitations and leg swelling.  Gastrointestinal:  Negative for abdominal distention, blood in stool, diarrhea and nausea.  Genitourinary:  Negative for frequency and hematuria.  Musculoskeletal:  Positive  for back pain. Negative for gait problem, joint swelling and neck pain.  Skin:  Negative for rash.  Neurological:  Negative for dizziness, tremors, speech difficulty and weakness.  Psychiatric/Behavioral:  Negative for agitation, dysphoric mood and sleep disturbance. The patient is not nervous/anxious.     Objective:  BP 110/60   Pulse 67   Temp 97.7 F (36.5 C) (Oral)   Ht 6' (1.829 m)   Wt 212 lb (96.2 kg)   SpO2 95%   BMI 28.75 kg/m   BP Readings from Last 3 Encounters:  04/26/23 110/60  03/29/23 107/62  03/25/23 124/84    Wt Readings from Last 3 Encounters:  04/26/23 212 lb (96.2 kg)  04/20/23 213 lb (96.6 kg)  03/29/23 213 lb 1.9 oz (96.7 kg)    Physical Exam Constitutional:      General: He is not in acute distress.    Appearance: Normal appearance. He is well-developed.     Comments: NAD  Eyes:     Conjunctiva/sclera: Conjunctivae normal.     Pupils: Pupils are equal, round, and reactive to light.  Neck:     Thyroid: No thyromegaly.     Vascular: No JVD.  Cardiovascular:     Rate and Rhythm: Normal rate and regular rhythm.     Heart sounds: Normal heart sounds. No murmur heard.    No friction rub. No gallop.  Pulmonary:     Effort:  Pulmonary effort is normal. No respiratory distress.     Breath sounds: Normal breath sounds. No wheezing or rales.  Chest:     Chest wall: No tenderness.  Abdominal:     General: Bowel sounds are normal. There is no distension.     Palpations: Abdomen is soft. There is no mass.     Tenderness: There is no abdominal tenderness. There is no guarding or rebound.  Musculoskeletal:        General: No tenderness. Normal range of motion.     Cervical back: Normal range of motion.  Lymphadenopathy:     Cervical: No cervical adenopathy.  Skin:    General: Skin is warm and dry.     Findings: No rash.  Neurological:     Mental Status: He is alert and oriented to person, place, and time.     Cranial Nerves: No cranial nerve  deficit.     Motor: No abnormal muscle tone.     Coordination: Coordination normal.     Gait: Gait normal.     Deep Tendon Reflexes: Reflexes are normal and symmetric.  Psychiatric:        Behavior: Behavior normal.        Thought Content: Thought content normal.        Judgment: Judgment normal.     Lab Results  Component Value Date   WBC 4.2 03/29/2023   HGB 12.7 (L) 03/29/2023   HCT 34.7 (L) 03/29/2023   PLT 122 (L) 03/29/2023   GLUCOSE 102 (H) 03/29/2023   CHOL 95 10/23/2022   TRIG 132.0 10/23/2022   HDL 37.70 (L) 10/23/2022   LDLDIRECT 80.9 04/16/2011   LDLCALC 31 10/23/2022   ALT 17 03/29/2023   AST 16 03/29/2023   NA 141 03/29/2023   K 5.1 03/29/2023   CL 107 03/29/2023   CREATININE 1.30 (H) 03/29/2023   BUN 25 (H) 03/29/2023   CO2 28 03/29/2023   TSH 2.00 10/23/2022   PSA 1.09 10/23/2022   HGBA1C 5.1 10/27/2018    MR Lumbar Spine Wo Contrast Result Date: 08/29/2021 CLINICAL DATA:  Right side low back pain and right leg weakness for 6-9 months. No known injury. EXAM: MRI LUMBAR SPINE WITHOUT CONTRAST TECHNIQUE: Multiplanar, multisequence MR imaging of the lumbar spine was performed. No intravenous contrast was administered. COMPARISON:  Plain films lumbar spine 06/26/2019. FINDINGS: Segmentation: On the prior plain films, the patient appears to have transitional lumbosacral anatomy. On this study, the lowest level imaged in the axial plane which is also the last fully open disc space is labeled L5-S1. Alignment:  Maintained. Vertebrae:  No fracture, evidence of discitis, or bone lesion. Conus medullaris and cauda equina: Conus extends to the L1 level. Conus and cauda equina appear normal. Paraspinal and other soft tissues: Negative. Disc levels: T12-L1: Shallow central protrusion without stenosis. L1-2: Negative. L2-3: Mild-to-moderate facet degenerative disease and a shallow disc bulge. There is mild central canal narrowing. The foramina are open. L3-4: Moderate facet  arthropathy, shallow disc bulge and ligamentum flavum thickening. There is moderate central canal stenosis with some narrowing in the subarticular recesses. The foramina mildly narrowed. L4-5: There is a shallow disc bulge with a superimposed small, down turning right lateral recess disc protrusion. Mild to moderate facet arthropathy and ligamentum flavum thickening are seen. Moderate central canal stenosis is present and there is narrowing in the right subarticular recess with encroachment on the descending right L5 root. Mild bilateral foraminal narrowing is seen. L5-S1: Negative. IMPRESSION: Transitional  lumbosacral anatomy. Please see numbering scheme above and correlate with plain films if any intervention is planned. Disc bulge with a small down turning right subarticular recess protrusion at L4-5 results in impingement on the descending right L5 root and moderate central canal stenosis. Mild bilateral foraminal narrowing is also seen at L4-5. Moderate central canal stenosis at L3-4 where there is also mild bilateral foraminal narrowing. Electronically Signed   By: Drusilla Kanner M.D.   On: 08/29/2021 08:39    Assessment & Plan:   Problem List Items Addressed This Visit     Low back pain - Primary   Recurrent LBP Back brace prn Tylenol prn      Leg cramping   Sporadic at night      Thrombocytopenia (HCC)   F/u w/Dr Myna Hidalgo      Relevant Orders   CBC with Differential/Platelet   Dyslipidemia   Crestor 3 per wk       Relevant Orders   TSH   Urinalysis   CBC with Differential/Platelet   Lipid panel   PSA   Comprehensive metabolic panel   Coronary atherosclerosis    CT coronary calcium score is 73 Take Crestor 3 per wk  Labs      Hypertension   Relevant Orders   TSH   Urinalysis   Lipid panel   PSA   Comprehensive metabolic panel      No orders of the defined types were placed in this encounter.     Follow-up: Return in about 6 months (around 10/27/2023) for  Wellness Exam.  Sonda Primes, MD

## 2023-04-26 NOTE — Assessment & Plan Note (Signed)
Crestor 3 per wk

## 2023-04-26 NOTE — Assessment & Plan Note (Addendum)
 Recurrent LBP Back brace prn Tylenol prn

## 2023-04-26 NOTE — Assessment & Plan Note (Addendum)
 CT coronary calcium score is 73 Take Crestor 3 per wk  Labs

## 2023-04-28 ENCOUNTER — Encounter: Payer: Medicare Other | Admitting: Internal Medicine

## 2023-05-14 DIAGNOSIS — H524 Presbyopia: Secondary | ICD-10-CM | POA: Diagnosis not present

## 2023-05-14 DIAGNOSIS — H2513 Age-related nuclear cataract, bilateral: Secondary | ICD-10-CM | POA: Diagnosis not present

## 2023-08-25 ENCOUNTER — Ambulatory Visit: Payer: Self-pay

## 2023-08-25 NOTE — Telephone Encounter (Signed)
 Reason for Disposition . Third attempt to contact caller AND no contact made. Phone number verified. . Message left on identified voice mail  Protocols used: No Contact or Duplicate Contact Call-A-AH

## 2023-08-25 NOTE — Telephone Encounter (Signed)
 FYI Only or Action Required?: FYI only for provider.  Patient was last seen in primary care on 04/26/2023 by Plotnikov, Karlynn GAILS, MD.  Called Nurse Triage reporting Rectal Bleeding.  Symptoms began about a month ago.  Interventions attempted: Nothing.  Symptoms are: unchanged.  Triage Disposition: See PCP When Office is Open (Within 3 Days), No Contact Calls  Patient/caregiver understands and will follow disposition?: Yes, will follow disposition  Pt c/o rectal bleed, started 4 weeks ago. Bright red blood in the water, states unsure if it is in the stool or not. Pt states that different amounts on different days, states that some days he has no blood. States that he has hx of hemorrhoids. Denies dizziness/lightheadedness. States constipated when this started. Denies taking blood thinners. Denies Abd pain. Pt states only occurs when having a BM. Reason for Disposition  MILD rectal bleeding (e.g., more than just a few drops or streaks)  Protocols used: Rectal Bleeding-A-AH

## 2023-08-25 NOTE — Telephone Encounter (Addendum)
 Pt self scheduled for rectal bleed, triage needed.  FYI Only or Action Required?: FYI only for provider.  Patient was last seen in primary care on 04/26/2023 by Plotnikov, Karlynn GAILS, MD.  Called Nurse Triage reporting No chief complaint on file..  Symptoms began na.  Interventions attempted: Other: na.  Symptoms are: unable to reach patient, unknown.  Triage Disposition: No Contact Calls  Patient/caregiver understands and will follow disposition?:  3 attempts to contact patient on both numbers have gone unanswered. Left voice mails to return call.   Routing to office for further follow up per work flow process.

## 2023-08-26 ENCOUNTER — Ambulatory Visit: Admitting: Internal Medicine

## 2023-08-26 VITALS — BP 90/62 | HR 69 | Temp 98.3°F | Ht 72.0 in | Wt 204.4 lb

## 2023-08-26 DIAGNOSIS — G8929 Other chronic pain: Secondary | ICD-10-CM | POA: Diagnosis not present

## 2023-08-26 DIAGNOSIS — M545 Low back pain, unspecified: Secondary | ICD-10-CM | POA: Diagnosis not present

## 2023-08-26 DIAGNOSIS — K921 Melena: Secondary | ICD-10-CM

## 2023-08-26 DIAGNOSIS — D61818 Other pancytopenia: Secondary | ICD-10-CM | POA: Diagnosis not present

## 2023-08-26 DIAGNOSIS — D696 Thrombocytopenia, unspecified: Secondary | ICD-10-CM | POA: Diagnosis not present

## 2023-08-26 LAB — COMPREHENSIVE METABOLIC PANEL WITH GFR
ALT: 20 U/L (ref 0–53)
AST: 19 U/L (ref 0–37)
Albumin: 4.6 g/dL (ref 3.5–5.2)
Alkaline Phosphatase: 65 U/L (ref 39–117)
BUN: 24 mg/dL — ABNORMAL HIGH (ref 6–23)
CO2: 32 meq/L (ref 19–32)
Calcium: 9.4 mg/dL (ref 8.4–10.5)
Chloride: 101 meq/L (ref 96–112)
Creatinine, Ser: 1.33 mg/dL (ref 0.40–1.50)
GFR: 52.82 mL/min — ABNORMAL LOW (ref >60.00)
Glucose, Bld: 106 mg/dL — ABNORMAL HIGH (ref 70–99)
Potassium: 4.3 meq/L (ref 3.5–5.1)
Sodium: 136 meq/L (ref 135–145)
Total Bilirubin: 0.6 mg/dL (ref 0.2–1.2)
Total Protein: 7.2 g/dL (ref 6.0–8.3)

## 2023-08-26 LAB — CBC WITH DIFFERENTIAL/PLATELET
Basophils Absolute: 0 K/uL (ref 0.0–0.1)
Basophils Relative: 0.6 % (ref 0.0–3.0)
Eosinophils Absolute: 0.1 K/uL (ref 0.0–0.7)
Eosinophils Relative: 2.1 % (ref 0.0–5.0)
HCT: 35.9 % — ABNORMAL LOW (ref 39.0–52.0)
Hemoglobin: 12.7 g/dL — ABNORMAL LOW (ref 13.0–17.0)
Lymphocytes Relative: 21.8 % (ref 12.0–46.0)
Lymphs Abs: 0.9 K/uL (ref 0.7–4.0)
MCHC: 35.3 g/dL (ref 30.0–36.0)
MCV: 93.2 fl (ref 78.0–100.0)
Monocytes Absolute: 0.6 K/uL (ref 0.1–1.0)
Monocytes Relative: 13.7 % — ABNORMAL HIGH (ref 3.0–12.0)
Neutro Abs: 2.6 K/uL (ref 1.4–7.7)
Neutrophils Relative %: 61.8 % (ref 43.0–77.0)
Platelets: 138 K/uL — ABNORMAL LOW (ref 150.0–400.0)
RBC: 3.85 Mil/uL — ABNORMAL LOW (ref 4.22–5.81)
RDW: 13.3 % (ref 11.5–15.5)
WBC: 4.2 K/uL (ref 4.0–10.5)

## 2023-08-26 MED ORDER — TRIAMCINOLONE ACETONIDE 0.1 % EX OINT: 1.0000 | TOPICAL_OINTMENT | Freq: Two times a day (BID) | CUTANEOUS | 2 refills | Status: AC

## 2023-08-26 MED ORDER — HYDROCORTISONE ACETATE 25 MG RE SUPP: 25.0000 mg | Freq: Two times a day (BID) | RECTAL | 1 refills | Status: AC

## 2023-08-26 NOTE — Progress Notes (Signed)
 Subjective:  Patient ID: Richard [name], male    DOB: July 20, 1950  Age: 73 y.o. MRN: [id number]  CC: Rectal Bleeding (Blood in stool, been going on since June 24th, blood in stool is bright red, some days it heavy and some days it light. Has experience no blood in the last 5 days. Some sensitivity around the area, no cramping or pain in the abdominal area. Pt has no changes in diet, but has made a change (taking stool softener (polyethylene glycol)and hemorrhoid cream, and also probiotics))   HPI Richard [name] presents for Rectal Bleeding (Blood in stool, been going on since June 24th, blood in stool is bright red, some days it heavy and some days it light. Has experience no blood in the last 5 days. Some sensitivity around the area, no cramping or pain in the abdominal area. Pt has no changes in diet, but has made a change (taking stool softener (polyethylene glycol)and hemorrhoid cream, and also probiotics)) C/o constipation - large stools Pt had colonoscopy in 01/2023 - hemorrhoids and diverticula Better on a dulcolax, miralax               Outpatient Medications Prior to Visit  Medication Sig Dispense Refill   aspirin  EC 81 MG tablet Take 81 mg by mouth daily. Swallow whole.     b complex vitamins capsule Take 1 capsule by mouth daily.     Cholecalciferol (VITAMIN D) 2000 UNITS CAPS Take by mouth daily.     doxazosin  (CARDURA ) 4 MG tablet TAKE 1 TABLET BY MOUTH DAILY  GENERIC EQUIVALENT FOR CARDURA  100 tablet 2   Fluorouracil  (TOLAK ) 4 % CREA Apply 1 application. topically at bedtime. Apply topically nightly for 2 weeks 40 g 0   Multiple Vitamin (MULTIVITAMIN) tablet Take 1 tablet by mouth daily.     Omega-3 Fatty Acids (FISH OIL PO) Take by mouth.     rosuvastatin  (CRESTOR ) 5 MG tablet TAKE 1 TABLET BY MOUTH DAILY 100 tablet 2   Thiamine HCl (VITAMIN B-1 PO) Take by mouth daily.     Turmeric (QC TUMERIC COMPLEX PO) Take by mouth daily.     vitamin C (ASCORBIC ACID)  500 MG tablet Take 500 mg by mouth daily.     triamcinolone  cream (KENALOG ) 0.1 % Apply 1 application topically 2 (two) times daily. 45 g 0   Flaxseed, Linseed, (FLAXSEED OIL PO) Take by mouth. Takes 1400 mg daily (Patient not taking: Reported on 08/26/2023)     No facility-administered medications prior to visit.    ROS: Review of Systems  Constitutional:  Negative for appetite change, fatigue and unexpected weight change.  HENT:  Negative for congestion, nosebleeds, sneezing, sore throat and trouble swallowing.   Eyes:  Negative for itching and visual disturbance.  Respiratory:  Negative for cough.   Cardiovascular:  Negative for chest pain, palpitations and leg swelling.  Gastrointestinal:  Positive for anal bleeding, blood in stool and constipation. Negative for abdominal distention, abdominal pain, diarrhea and nausea.  Genitourinary:  Negative for frequency and hematuria.  Musculoskeletal:  Negative for back pain, gait problem, joint swelling and neck pain.  Skin:  Negative for color change and rash.  Neurological:  Negative for dizziness, tremors, speech difficulty, weakness and light-headedness.  Hematological:  Does not bruise/bleed easily.  Psychiatric/Behavioral:  Negative for agitation, dysphoric mood, sleep disturbance and suicidal ideas. The patient is not nervous/anxious.     Objective:  BP 90/62   Pulse 69   Temp 98.3 F (  36.8 C) (Temporal)   Ht 6' (1.829 m)   Wt 204 lb 6 oz (92.7 kg)   SpO2 98%   BMI 27.72 kg/m   BP Readings from Last 3 Encounters:  08/26/23 90/62  04/26/23 110/60  03/29/23 107/62    Wt Readings from Last 3 Encounters:  08/26/23 204 lb 6 oz (92.7 kg)  04/26/23 212 lb (96.2 kg)  04/20/23 213 lb (96.6 kg)    Physical Exam Constitutional:      General: He is not in acute distress.    Appearance: He is well-developed.     Comments: NAD  Eyes:     Conjunctiva/sclera: Conjunctivae normal.     Pupils: Pupils are equal, round, and reactive  to light.  Neck:     Thyroid : No thyromegaly.     Vascular: No JVD.  Cardiovascular:     Rate and Rhythm: Normal rate and regular rhythm.     Heart sounds: Normal heart sounds. No murmur heard.    No friction rub. No gallop.  Pulmonary:     Effort: Pulmonary effort is normal. No respiratory distress.     Breath sounds: Normal breath sounds. No wheezing or rales.  Chest:     Chest wall: No tenderness.  Abdominal:     General: Bowel sounds are normal. There is no distension.     Palpations: Abdomen is soft. There is no mass.     Tenderness: There is no abdominal tenderness. There is no guarding or rebound.  Genitourinary:    Prostate: Normal.     Rectum: Normal. Guaiac result negative.  Musculoskeletal:        General: No tenderness. Normal range of motion.     Cervical back: Normal range of motion.  Lymphadenopathy:     Cervical: No cervical adenopathy.  Skin:    General: Skin is warm and dry.     Findings: No rash.  Neurological:     Mental Status: He is alert and oriented to person, place, and time.     Cranial Nerves: No cranial nerve deficit.     Motor: No abnormal muscle tone.     Coordination: Coordination normal.     Gait: Gait normal.     Deep Tendon Reflexes: Reflexes are normal and symmetric.  Psychiatric:        Behavior: Behavior normal.        Thought Content: Thought content normal.        Judgment: Judgment normal.   Large ext hemorrhouid between 12 and 6 o'clock - no clot, rupture or fissuer Rectal exam nontender, no mass  Lab Results  Component Value Date   WBC 4.2 08/26/2023   HGB 12.7 (L) 08/26/2023   HCT 35.9 (L) 08/26/2023   PLT 138.0 (L) 08/26/2023   GLUCOSE 106 (H) 08/26/2023   CHOL 95 10/23/2022   TRIG 132.0 10/23/2022   HDL 37.70 (L) 10/23/2022   LDLDIRECT 80.9 04/16/2011   LDLCALC 31 10/23/2022   ALT 20 08/26/2023   AST 19 08/26/2023   NA 136 08/26/2023   K 4.3 08/26/2023   CL 101 08/26/2023   CREATININE 1.33 08/26/2023   BUN 24  (H) 08/26/2023   CO2 32 08/26/2023   TSH 2.00 10/23/2022   PSA 1.09 10/23/2022   HGBA1C 5.1 10/27/2018    MR Lumbar Spine Wo Contrast Result Date: 08/29/2021 CLINICAL DATA:  Right side low back pain and right leg weakness for 6-9 months. No known injury. EXAM: MRI LUMBAR SPINE WITHOUT CONTRAST TECHNIQUE:  Multiplanar, multisequence MR imaging of the lumbar spine was performed. No intravenous contrast was administered. COMPARISON:  Plain films lumbar spine 06/26/2019. FINDINGS: Segmentation: On the prior plain films, the patient appears to have transitional lumbosacral anatomy. On this study, the lowest level imaged in the axial plane which is also the last fully open disc space is labeled L5-S1. Alignment:  Maintained. Vertebrae:  No fracture, evidence of discitis, or bone lesion. Conus medullaris and cauda equina: Conus extends to the L1 level. Conus and cauda equina appear normal. Paraspinal and other soft tissues: Negative. Disc levels: T12-L1: Shallow central protrusion without stenosis. L1-2: Negative. L2-3: Mild-to-moderate facet degenerative disease and a shallow disc bulge. There is mild central canal narrowing. The foramina are open. L3-4: Moderate facet arthropathy, shallow disc bulge and ligamentum flavum thickening. There is moderate central canal stenosis with some narrowing in the subarticular recesses. The foramina mildly narrowed. L4-5: There is a shallow disc bulge with a superimposed small, down turning right lateral recess disc protrusion. Mild to moderate facet arthropathy and ligamentum flavum thickening are seen. Moderate central canal stenosis is present and there is narrowing in the right subarticular recess with encroachment on the descending right L5 root. Mild bilateral foraminal narrowing is seen. L5-S1: Negative. IMPRESSION: Transitional lumbosacral anatomy. Please see numbering scheme above and correlate with plain films if any intervention is planned. Disc bulge with a small  down turning right subarticular recess protrusion at L4-5 results in impingement on the descending right L5 root and moderate central canal stenosis. Mild bilateral foraminal narrowing is also seen at L4-5. Moderate central canal stenosis at L3-4 where there is also mild bilateral foraminal narrowing. Electronically Signed   By: Debby Prader M.D.   On: 08/29/2021 08:39    Assessment & Plan:   Problem List Items Addressed This Visit     Low back pain   Occasional recurrence      Thrombocytopenia (HCC)   F/u w/Dr Timmy      Pancytopenia (HCC)    F/u w/Dr Timmy Monitoring CBC      Hematochezia - Primary   New Rectal Bleeding (Blood in stool, been going on since June 24th, blood in stool is bright red, some days it heavy and some days it light. Has experience no blood in the last 5 days. Some sensitivity around the area, no cramping or pain in the abdominal area. Pt has no changes in diet, but has made a change (taking stool softener (polyethylene glycol)and hemorrhoid cream, and also probiotics)) C/o constipation - large stools, pushing hard. Possible anal fissure versus hemorrhoid versus other Pt had colonoscopy in 01/2023 - hemorrhoids and diverticulaLikely due to hemorrhoids or a fissure. Less likely a diverticular bleed. GI ref if relapsed Anusol  HC supp Rx Triamc oint bid Use wet wipes Miralax, Colace qd       Relevant Orders   Comprehensive metabolic panel with GFR (Completed)   CBC with Differential/Platelet (Completed)      Meds ordered this encounter  Medications   hydrocortisone  (ANUSOL -HC) 25 MG suppository    Sig: Place 1 suppository (25 mg total) rectally 2 (two) times daily.    Dispense:  20 suppository    Refill:  1   triamcinolone  ointment (KENALOG ) 0.1 %    Sig: Apply 1 Application topically 2 (two) times daily.    Dispense:  80 g    Refill:  2      Follow-up: Return in about 6 weeks (around 10/07/2023) for a follow-up visit.  Alex  Shataria Crist, MD

## 2023-08-26 NOTE — Assessment & Plan Note (Addendum)
 New Rectal Bleeding (Blood in stool, been going on since June 24th, blood in stool is bright red, some days it heavy and some days it light. Has experience no blood in the last 5 days. Some sensitivity around the area, no cramping or pain in the abdominal area. Pt has no changes in diet, but has made a change (taking stool softener (polyethylene glycol)and hemorrhoid cream, and also probiotics)) C/o constipation - large stools, pushing hard. Possible anal fissure versus hemorrhoid versus other Pt had colonoscopy in 01/2023 - hemorrhoids and diverticulaLikely due to hemorrhoids or a fissure. Less likely a diverticular bleed. GI ref if relapsed Anusol  HC supp Rx Triamc oint bid Use wet wipes Miralax, Colace qd

## 2023-08-27 ENCOUNTER — Ambulatory Visit: Payer: Self-pay | Admitting: Internal Medicine

## 2023-08-29 ENCOUNTER — Encounter: Payer: Self-pay | Admitting: Internal Medicine

## 2023-08-29 NOTE — Assessment & Plan Note (Signed)
F/u w/Dr Marin Olp

## 2023-08-29 NOTE — Assessment & Plan Note (Signed)
 Occasional recurrence

## 2023-08-29 NOTE — Assessment & Plan Note (Signed)
F/u w/Dr Marin Olp. Monitoring CBC

## 2023-09-09 ENCOUNTER — Other Ambulatory Visit: Payer: Self-pay | Admitting: Internal Medicine

## 2023-10-27 ENCOUNTER — Encounter: Payer: Self-pay | Admitting: Internal Medicine

## 2023-10-27 ENCOUNTER — Ambulatory Visit: Admitting: Internal Medicine

## 2023-10-27 ENCOUNTER — Other Ambulatory Visit (HOSPITAL_BASED_OUTPATIENT_CLINIC_OR_DEPARTMENT_OTHER): Payer: Self-pay

## 2023-10-27 VITALS — BP 94/62 | HR 64 | Temp 98.0°F | Ht 72.0 in | Wt 198.4 lb

## 2023-10-27 DIAGNOSIS — I2583 Coronary atherosclerosis due to lipid rich plaque: Secondary | ICD-10-CM | POA: Diagnosis not present

## 2023-10-27 DIAGNOSIS — N400 Enlarged prostate without lower urinary tract symptoms: Secondary | ICD-10-CM

## 2023-10-27 DIAGNOSIS — D696 Thrombocytopenia, unspecified: Secondary | ICD-10-CM

## 2023-10-27 DIAGNOSIS — G8929 Other chronic pain: Secondary | ICD-10-CM | POA: Diagnosis not present

## 2023-10-27 DIAGNOSIS — K921 Melena: Secondary | ICD-10-CM | POA: Diagnosis not present

## 2023-10-27 DIAGNOSIS — M545 Low back pain, unspecified: Secondary | ICD-10-CM

## 2023-10-27 DIAGNOSIS — I10 Essential (primary) hypertension: Secondary | ICD-10-CM

## 2023-10-27 DIAGNOSIS — E785 Hyperlipidemia, unspecified: Secondary | ICD-10-CM

## 2023-10-27 LAB — CBC WITH DIFFERENTIAL/PLATELET
Basophils Absolute: 0 K/uL (ref 0.0–0.1)
Basophils Relative: 0.5 % (ref 0.0–3.0)
Eosinophils Absolute: 0.1 K/uL (ref 0.0–0.7)
Eosinophils Relative: 2.9 % (ref 0.0–5.0)
HCT: 36 % — ABNORMAL LOW (ref 39.0–52.0)
Hemoglobin: 12.7 g/dL — ABNORMAL LOW (ref 13.0–17.0)
Lymphocytes Relative: 27.8 % (ref 12.0–46.0)
Lymphs Abs: 1.1 K/uL (ref 0.7–4.0)
MCHC: 35.3 g/dL (ref 30.0–36.0)
MCV: 93 fl (ref 78.0–100.0)
Monocytes Absolute: 0.4 K/uL (ref 0.1–1.0)
Monocytes Relative: 11.1 % (ref 3.0–12.0)
Neutro Abs: 2.3 K/uL (ref 1.4–7.7)
Neutrophils Relative %: 57.7 % (ref 43.0–77.0)
Platelets: 130 K/uL — ABNORMAL LOW (ref 150.0–400.0)
RBC: 3.87 Mil/uL — ABNORMAL LOW (ref 4.22–5.81)
RDW: 13.1 % (ref 11.5–15.5)
WBC: 4 K/uL (ref 4.0–10.5)

## 2023-10-27 LAB — LIPID PANEL
Cholesterol: 108 mg/dL (ref 0–200)
HDL: 38.3 mg/dL — ABNORMAL LOW (ref >39.00)
LDL Cholesterol: 45 mg/dL (ref 0–99)
NonHDL: 69.53
Total CHOL/HDL Ratio: 3
Triglycerides: 122 mg/dL (ref 0.0–149.0)
VLDL: 24.4 mg/dL (ref 0.0–40.0)

## 2023-10-27 LAB — COMPREHENSIVE METABOLIC PANEL WITH GFR
ALT: 17 U/L (ref 0–53)
AST: 15 U/L (ref 0–37)
Albumin: 4.4 g/dL (ref 3.5–5.2)
Alkaline Phosphatase: 56 U/L (ref 39–117)
BUN: 25 mg/dL — ABNORMAL HIGH (ref 6–23)
CO2: 28 meq/L (ref 19–32)
Calcium: 9.3 mg/dL (ref 8.4–10.5)
Chloride: 105 meq/L (ref 96–112)
Creatinine, Ser: 1.28 mg/dL (ref 0.40–1.50)
GFR: 55.24 mL/min — ABNORMAL LOW (ref >60.00)
Glucose, Bld: 100 mg/dL — ABNORMAL HIGH (ref 70–99)
Potassium: 4.1 meq/L (ref 3.5–5.1)
Sodium: 140 meq/L (ref 135–145)
Total Bilirubin: 0.8 mg/dL (ref 0.2–1.2)
Total Protein: 6.8 g/dL (ref 6.0–8.3)

## 2023-10-27 LAB — URINALYSIS
Bilirubin Urine: NEGATIVE
Hgb urine dipstick: NEGATIVE
Ketones, ur: NEGATIVE
Leukocytes,Ua: NEGATIVE
Nitrite: NEGATIVE
Specific Gravity, Urine: >=1.03 — AB (ref 1.000–1.030)
Total Protein, Urine: NEGATIVE
Urine Glucose: NEGATIVE
Urobilinogen, UA: 0.2 (ref 0.0–1.0)
pH: 6 (ref 5.0–8.0)

## 2023-10-27 LAB — TSH: TSH: 1.62 u[IU]/mL (ref 0.35–5.50)

## 2023-10-27 LAB — PSA: PSA: 1.25 ng/mL (ref 0.10–4.00)

## 2023-10-27 MED ORDER — COVID-19MRNA BIVAL VACC PFIZER 30 MCG/0.3ML IM SUSP: 0.3000 mL | Freq: Once | INTRAMUSCULAR | 0 refills | Status: AC

## 2023-10-27 NOTE — Assessment & Plan Note (Signed)
 Occasional recurrence

## 2023-10-27 NOTE — Assessment & Plan Note (Signed)
Cont on Doxazosin at night

## 2023-10-27 NOTE — Assessment & Plan Note (Signed)
 CT coronary calcium  score is 73 On Crestor  3 per wk  Labs - check lipids

## 2023-10-27 NOTE — Assessment & Plan Note (Signed)
 BP Readings from Last 3 Encounters:  10/27/23 94/62  08/26/23 90/62  04/26/23 110/60

## 2023-10-27 NOTE — Progress Notes (Signed)
 Subjective:  Patient ID: Richard [name], male    DOB: Sep 25, 1950  Age: 73 y.o. MRN: [id number]  CC: Medical Management of Chronic Issues (6 Month follow up)   HPI [employer] presents for HTN, dyslipidemia, LBP  Outpatient Medications Prior to Visit  Medication Sig Dispense Refill   aspirin  EC 81 MG tablet Take 81 mg by mouth daily. Swallow whole.     b complex vitamins capsule Take 1 capsule by mouth daily.     Cholecalciferol (VITAMIN D) 2000 UNITS CAPS Take by mouth daily.     doxazosin  (CARDURA ) 4 MG tablet TAKE 1 TABLET BY MOUTH DAILY  GENERIC EQUIVALENT FOR CARDURA  100 tablet 2   Flaxseed, Linseed, (FLAXSEED OIL PO) Take by mouth. Takes 1400 mg daily (Patient not taking: Reported on 08/26/2023)     Fluorouracil  (TOLAK ) 4 % CREA Apply 1 application. topically at bedtime. Apply topically nightly for 2 weeks 40 g 0   hydrocortisone  (ANUSOL -HC) 25 MG suppository Place 1 suppository (25 mg total) rectally 2 (two) times daily. 20 suppository 1   Multiple Vitamin (MULTIVITAMIN) tablet Take 1 tablet by mouth daily.     Omega-3 Fatty Acids (FISH OIL PO) Take by mouth.     rosuvastatin  (CRESTOR ) 5 MG tablet TAKE 1 TABLET BY MOUTH DAILY 100 tablet 2   Thiamine HCl (VITAMIN B-1 PO) Take by mouth daily.     triamcinolone  ointment (KENALOG ) 0.1 % Apply 1 Application topically 2 (two) times daily. 80 g 2   Turmeric (QC TUMERIC COMPLEX PO) Take by mouth daily.     vitamin C (ASCORBIC ACID) 500 MG tablet Take 500 mg by mouth daily.     No facility-administered medications prior to visit.    ROS: Review of Systems  Constitutional:  Negative for appetite change, fatigue and unexpected weight change.  HENT:  Negative for congestion, nosebleeds, sneezing, sore throat and trouble swallowing.   Eyes:  Negative for itching and visual disturbance.  Respiratory:  Negative for cough.   Cardiovascular:  Negative for chest pain, palpitations and leg swelling.  Gastrointestinal:  Negative for  abdominal distention, blood in stool, diarrhea and nausea.  Genitourinary:  Negative for frequency and hematuria.  Musculoskeletal:  Positive for arthralgias and back pain. Negative for gait problem, joint swelling and neck pain.  Skin:  Negative for rash.  Neurological:  Negative for dizziness, tremors, speech difficulty and weakness.  Psychiatric/Behavioral:  Negative for agitation, dysphoric mood and sleep disturbance. The patient is not nervous/anxious.     Objective:  BP 94/62   Pulse 64   Temp 98 F (36.7 C)   Ht 6' (1.829 m)   Wt 198 lb 6.4 oz (90 kg)   SpO2 97%   BMI 26.91 kg/m   BP Readings from Last 3 Encounters:  10/27/23 94/62  08/26/23 90/62  04/26/23 110/60    Wt Readings from Last 3 Encounters:  10/27/23 198 lb 6.4 oz (90 kg)  08/26/23 204 lb 6 oz (92.7 kg)  04/26/23 212 lb (96.2 kg)    Physical Exam Constitutional:      General: He is not in acute distress.    Appearance: He is well-developed.     Comments: NAD  Eyes:     Conjunctiva/sclera: Conjunctivae normal.     Pupils: Pupils are equal, round, and reactive to light.  Neck:     Thyroid : No thyromegaly.     Vascular: No JVD.  Cardiovascular:     Rate and Rhythm: Normal rate  and regular rhythm.     Heart sounds: Normal heart sounds. No murmur heard.    No friction rub. No gallop.  Pulmonary:     Effort: Pulmonary effort is normal. No respiratory distress.     Breath sounds: Normal breath sounds. No wheezing or rales.  Chest:     Chest wall: No tenderness.  Abdominal:     General: Bowel sounds are normal. There is no distension.     Palpations: Abdomen is soft. There is no mass.     Tenderness: There is no abdominal tenderness. There is no guarding or rebound.  Musculoskeletal:        General: Tenderness present. Normal range of motion.     Cervical back: Normal range of motion.  Lymphadenopathy:     Cervical: No cervical adenopathy.  Skin:    General: Skin is warm and dry.     Findings:  No rash.  Neurological:     Mental Status: He is alert and oriented to person, place, and time.     Cranial Nerves: No cranial nerve deficit.     Motor: No abnormal muscle tone.     Coordination: Coordination normal.     Gait: Gait abnormal.     Deep Tendon Reflexes: Reflexes are normal and symmetric.  Psychiatric:        Behavior: Behavior normal.        Thought Content: Thought content normal.        Judgment: Judgment normal.     Lab Results  Component Value Date   WBC 4.2 08/26/2023   HGB 12.7 (L) 08/26/2023   HCT 35.9 (L) 08/26/2023   PLT 138.0 (L) 08/26/2023   GLUCOSE 106 (H) 08/26/2023   CHOL 95 10/23/2022   TRIG 132.0 10/23/2022   HDL 37.70 (L) 10/23/2022   LDLDIRECT 80.9 04/16/2011   LDLCALC 31 10/23/2022   ALT 20 08/26/2023   AST 19 08/26/2023   NA 136 08/26/2023   K 4.3 08/26/2023   CL 101 08/26/2023   CREATININE 1.33 08/26/2023   BUN 24 (H) 08/26/2023   CO2 32 08/26/2023   TSH 2.00 10/23/2022   PSA 1.09 10/23/2022   HGBA1C 5.1 10/27/2018    MR Lumbar Spine Wo Contrast Result Date: 08/29/2021 CLINICAL DATA:  Right side low back pain and right leg weakness for 6-9 months. No known injury. EXAM: MRI LUMBAR SPINE WITHOUT CONTRAST TECHNIQUE: Multiplanar, multisequence MR imaging of the lumbar spine was performed. No intravenous contrast was administered. COMPARISON:  Plain films lumbar spine 06/26/2019. FINDINGS: Segmentation: On the prior plain films, the patient appears to have transitional lumbosacral anatomy. On this study, the lowest level imaged in the axial plane which is also the last fully open disc space is labeled L5-S1. Alignment:  Maintained. Vertebrae:  No fracture, evidence of discitis, or bone lesion. Conus medullaris and cauda equina: Conus extends to the L1 level. Conus and cauda equina appear normal. Paraspinal and other soft tissues: Negative. Disc levels: T12-L1: Shallow central protrusion without stenosis. L1-2: Negative. L2-3: Mild-to-moderate  facet degenerative disease and a shallow disc bulge. There is mild central canal narrowing. The foramina are open. L3-4: Moderate facet arthropathy, shallow disc bulge and ligamentum flavum thickening. There is moderate central canal stenosis with some narrowing in the subarticular recesses. The foramina mildly narrowed. L4-5: There is a shallow disc bulge with a superimposed small, down turning right lateral recess disc protrusion. Mild to moderate facet arthropathy and ligamentum flavum thickening are seen. Moderate central canal stenosis  is present and there is narrowing in the right subarticular recess with encroachment on the descending right L5 root. Mild bilateral foraminal narrowing is seen. L5-S1: Negative. IMPRESSION: Transitional lumbosacral anatomy. Please see numbering scheme above and correlate with plain films if any intervention is planned. Disc bulge with a small down turning right subarticular recess protrusion at L4-5 results in impingement on the descending right L5 root and moderate central canal stenosis. Mild bilateral foraminal narrowing is also seen at L4-5. Moderate central canal stenosis at L3-4 where there is also mild bilateral foraminal narrowing. Electronically Signed   By: Debby Prader M.D.   On: 08/29/2021 08:39    Assessment & Plan:   Problem List Items Addressed This Visit     BPH (benign prostatic hyperplasia)   Cont on Doxazosin  at night      Coronary atherosclerosis    CT coronary calcium  score is 73 On Crestor  3 per wk  Labs - check lipids      Dyslipidemia   Crestor  3 per wk       Hematochezia   No relapse      Hypertension   BP Readings from Last 3 Encounters:  10/27/23 94/62  08/26/23 90/62  04/26/23 110/60         Low back pain - Primary   Occasional recurrence         Meds ordered this encounter  Medications   COVID-19 mRNA bivalent vaccine, Pfizer, injection    Sig: Inject 0.3 mLs into the muscle once for 1 dose.     Dispense:  0.3 mL    Refill:  0      Follow-up: Return in about 6 months (around 04/25/2024) for Wellness Exam.  Marolyn Noel, MD

## 2023-10-27 NOTE — Assessment & Plan Note (Signed)
No relapse 

## 2023-10-27 NOTE — Assessment & Plan Note (Signed)
Crestor 3 per wk

## 2023-11-02 ENCOUNTER — Ambulatory Visit: Payer: Self-pay | Admitting: Internal Medicine

## 2024-03-28 ENCOUNTER — Inpatient Hospital Stay: Payer: Medicare Other

## 2024-03-28 ENCOUNTER — Ambulatory Visit: Payer: Medicare Other | Admitting: Hematology & Oncology

## 2024-03-29 ENCOUNTER — Inpatient Hospital Stay: Admitting: Hematology & Oncology

## 2024-03-29 ENCOUNTER — Inpatient Hospital Stay

## 2024-04-24 ENCOUNTER — Ambulatory Visit: Admitting: Internal Medicine

## 2024-05-02 ENCOUNTER — Ambulatory Visit: Admitting: Internal Medicine
